# Patient Record
Sex: Female | Born: 1965 | Race: Asian | Hispanic: No | Smoking: Never smoker
Health system: Southern US, Community
[De-identification: ages and names within clinical notes are randomized; demographics above are authoritative.]

## PROBLEM LIST (undated history)

## (undated) DIAGNOSIS — K219 Gastro-esophageal reflux disease without esophagitis: Secondary | ICD-10-CM

## (undated) DIAGNOSIS — R922 Inconclusive mammogram: Secondary | ICD-10-CM

## (undated) DIAGNOSIS — R011 Cardiac murmur, unspecified: Secondary | ICD-10-CM

## (undated) DIAGNOSIS — R87619 Unspecified abnormal cytological findings in specimens from cervix uteri: Secondary | ICD-10-CM

## (undated) DIAGNOSIS — M5412 Radiculopathy, cervical region: Secondary | ICD-10-CM

## (undated) DIAGNOSIS — R9230 Dense breasts, unspecified: Secondary | ICD-10-CM

## (undated) DIAGNOSIS — I080 Rheumatic disorders of both mitral and aortic valves: Secondary | ICD-10-CM

## (undated) DIAGNOSIS — N893 Dysplasia of vagina, unspecified: Secondary | ICD-10-CM

## (undated) DIAGNOSIS — M2041 Other hammer toe(s) (acquired), right foot: Secondary | ICD-10-CM

## (undated) DIAGNOSIS — J302 Other seasonal allergic rhinitis: Secondary | ICD-10-CM

## (undated) DIAGNOSIS — D1803 Hemangioma of intra-abdominal structures: Secondary | ICD-10-CM

## (undated) DIAGNOSIS — M1990 Unspecified osteoarthritis, unspecified site: Secondary | ICD-10-CM

## (undated) HISTORY — DX: Unspecified abnormal cytological findings in specimens from cervix uteri: R87.619

## (undated) HISTORY — PX: CHOLECYSTECTOMY: SHX55

## (undated) HISTORY — DX: Dense breasts, unspecified: R92.30

## (undated) HISTORY — PX: ABDOMINAL HYSTERECTOMY: SHX81

## (undated) HISTORY — DX: Dysplasia of vagina, unspecified: N89.3

## (undated) HISTORY — DX: Gastro-esophageal reflux disease without esophagitis: K21.9

## (undated) HISTORY — DX: Inconclusive mammogram: R92.2

---

## 2003-12-10 HISTORY — PX: VAGINAL HYSTERECTOMY: SHX2639

## 2007-01-21 ENCOUNTER — Ambulatory Visit: Payer: Self-pay | Admitting: Gynecologic Oncology

## 2007-06-09 ENCOUNTER — Ambulatory Visit: Payer: Self-pay | Admitting: Gynecologic Oncology

## 2007-09-01 ENCOUNTER — Ambulatory Visit: Payer: Self-pay | Admitting: Gynecologic Oncology

## 2007-12-08 ENCOUNTER — Ambulatory Visit: Payer: Self-pay | Admitting: Gynecologic Oncology

## 2008-07-05 ENCOUNTER — Ambulatory Visit: Payer: Self-pay | Admitting: Gynecologic Oncology

## 2009-01-03 ENCOUNTER — Ambulatory Visit: Payer: Self-pay | Admitting: Gynecologic Oncology

## 2009-03-09 ENCOUNTER — Ambulatory Visit: Payer: Self-pay | Admitting: Gynecologic Oncology

## 2009-03-14 ENCOUNTER — Ambulatory Visit: Payer: Self-pay | Admitting: Gynecologic Oncology

## 2010-05-09 ENCOUNTER — Ambulatory Visit: Payer: Self-pay | Admitting: Gynecologic Oncology

## 2010-05-29 ENCOUNTER — Ambulatory Visit: Payer: Self-pay | Admitting: Gynecologic Oncology

## 2010-06-08 ENCOUNTER — Ambulatory Visit: Payer: Self-pay | Admitting: Gynecologic Oncology

## 2011-10-22 ENCOUNTER — Ambulatory Visit: Payer: Self-pay | Admitting: Gynecologic Oncology

## 2011-11-09 ENCOUNTER — Ambulatory Visit: Payer: Self-pay | Admitting: Gynecologic Oncology

## 2011-12-24 ENCOUNTER — Ambulatory Visit: Payer: Self-pay | Admitting: Gynecologic Oncology

## 2012-10-27 ENCOUNTER — Ambulatory Visit: Payer: Self-pay | Admitting: Gynecologic Oncology

## 2012-11-08 ENCOUNTER — Ambulatory Visit: Payer: Self-pay | Admitting: Gynecologic Oncology

## 2012-12-09 DIAGNOSIS — K5790 Diverticulosis of intestine, part unspecified, without perforation or abscess without bleeding: Secondary | ICD-10-CM

## 2012-12-09 HISTORY — DX: Diverticulosis of intestine, part unspecified, without perforation or abscess without bleeding: K57.90

## 2012-12-10 ENCOUNTER — Ambulatory Visit: Payer: Self-pay | Admitting: Surgery

## 2012-12-10 LAB — COMPREHENSIVE METABOLIC PANEL
Albumin: 3.5 g/dL (ref 3.4–5.0)
Anion Gap: 4 — ABNORMAL LOW (ref 7–16)
BUN: 17 mg/dL (ref 7–18)
Bilirubin,Total: 0.2 mg/dL (ref 0.2–1.0)
Calcium, Total: 8.9 mg/dL (ref 8.5–10.1)
Creatinine: 0.91 mg/dL (ref 0.60–1.30)
EGFR (African American): >60
Glucose: 90 mg/dL (ref 65–99)
Osmolality: 280 (ref 275–301)
Potassium: 4.5 mmol/L (ref 3.5–5.1)

## 2012-12-16 ENCOUNTER — Ambulatory Visit: Payer: Self-pay | Admitting: Surgery

## 2012-12-16 HISTORY — PX: CHOLECYSTECTOMY: SHX55

## 2012-12-17 LAB — PATHOLOGY REPORT

## 2012-12-25 ENCOUNTER — Encounter: Payer: Self-pay | Admitting: Internal Medicine

## 2012-12-25 ENCOUNTER — Ambulatory Visit (INDEPENDENT_AMBULATORY_CARE_PROVIDER_SITE_OTHER): Payer: BC Managed Care – PPO | Admitting: Internal Medicine

## 2012-12-25 DIAGNOSIS — Z8619 Personal history of other infectious and parasitic diseases: Secondary | ICD-10-CM | POA: Insufficient documentation

## 2012-12-25 DIAGNOSIS — Z90710 Acquired absence of both cervix and uterus: Secondary | ICD-10-CM | POA: Insufficient documentation

## 2012-12-25 DIAGNOSIS — D1803 Hemangioma of intra-abdominal structures: Secondary | ICD-10-CM

## 2012-12-25 DIAGNOSIS — Z9049 Acquired absence of other specified parts of digestive tract: Secondary | ICD-10-CM | POA: Insufficient documentation

## 2012-12-25 DIAGNOSIS — Z0000 Encounter for general adult medical examination without abnormal findings: Secondary | ICD-10-CM

## 2012-12-25 DIAGNOSIS — Z9089 Acquired absence of other organs: Secondary | ICD-10-CM

## 2012-12-25 DIAGNOSIS — B069 Rubella without complication: Secondary | ICD-10-CM | POA: Insufficient documentation

## 2012-12-25 DIAGNOSIS — Z789 Other specified health status: Secondary | ICD-10-CM

## 2012-12-25 DIAGNOSIS — O98519 Other viral diseases complicating pregnancy, unspecified trimester: Secondary | ICD-10-CM | POA: Insufficient documentation

## 2012-12-25 DIAGNOSIS — Z23 Encounter for immunization: Secondary | ICD-10-CM

## 2012-12-25 MED ORDER — ATOVAQUONE-PROGUANIL HCL 250-100 MG PO TABS: 1.0000 | ORAL_TABLET | Freq: Every day | ORAL | Status: DC

## 2012-12-25 MED ORDER — AZITHROMYCIN 500 MG PO TABS: ORAL_TABLET | ORAL | Status: DC

## 2012-12-25 NOTE — Progress Notes (Signed)
[facility]  RFV: going to [place] and [place] for 1 month Subjective:    Patient ID: Cassandra Wang, female    DOB: 01-22-1966, 47 y.o.   MRN: [id number]  HPI  46 yo F originally from [place], but now resides in the [place] since 1984, going on a trip from feb 18- march 19. To [place] and [place]. Going on trip with her female companion. Predominantly doing tours and visiting family in [place], and just tours in [place].  She is recovering from cholecystectomy performed 10 days ago. Unexpected rash from chlorhexadine prep.  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 measles  Review of Systems     Objective:   Physical Exam        Assessment & Plan:  1) malaria proph = only needed when they go to [place] area in [place]. Will give #17 tabs. Plus precautions   2) traveler's diarrhea= will give rx for azithromycin. With directions how to use. Plus precautions   3) pre travel vax = will give hep A #1, typhoid inj,MMR, and Tdap  4) motion sickness = can take dramamine as needed   rtc in 6 months for hep A #2

## 2012-12-31 ENCOUNTER — Ambulatory Visit: Payer: Self-pay | Admitting: Oncology

## 2013-01-13 ENCOUNTER — Ambulatory Visit: Payer: Self-pay | Admitting: Oncology

## 2013-01-15 ENCOUNTER — Ambulatory Visit: Payer: Self-pay | Admitting: Gastroenterology

## 2013-03-29 ENCOUNTER — Encounter: Payer: Self-pay | Admitting: *Deleted

## 2013-06-18 ENCOUNTER — Other Ambulatory Visit: Payer: Self-pay | Admitting: *Deleted

## 2013-06-18 ENCOUNTER — Ambulatory Visit (INDEPENDENT_AMBULATORY_CARE_PROVIDER_SITE_OTHER): Payer: BC Managed Care – PPO | Admitting: *Deleted

## 2013-06-18 DIAGNOSIS — Z789 Other specified health status: Secondary | ICD-10-CM

## 2013-06-18 DIAGNOSIS — Z23 Encounter for immunization: Secondary | ICD-10-CM

## 2013-06-18 MED ORDER — ATOVAQUONE-PROGUANIL HCL 250-100 MG PO TABS: 1.0000 | ORAL_TABLET | Freq: Every day | ORAL | Status: DC

## 2013-06-18 NOTE — Addendum Note (Signed)
Addended by: Jennet Maduro D on: 06/18/2013 09:39 AM   Modules accepted: Orders

## 2013-07-21 ENCOUNTER — Emergency Department: Payer: Self-pay | Admitting: Emergency Medicine

## 2013-07-21 ENCOUNTER — Ambulatory Visit: Payer: Self-pay | Admitting: Gynecologic Oncology

## 2013-07-21 LAB — BASIC METABOLIC PANEL
Anion Gap: 3 — ABNORMAL LOW (ref 7–16)
BUN: 18 mg/dL (ref 7–18)
Creatinine: 0.79 mg/dL (ref 0.60–1.30)
EGFR (African American): >60
Potassium: 3.5 mmol/L (ref 3.5–5.1)

## 2013-07-21 LAB — CBC
HGB: 13.6 g/dL (ref 12.0–16.0)
MCH: 28.9 pg (ref 26.0–34.0)
RDW: 13.4 % (ref 11.5–14.5)
WBC: 8.2 10*3/uL (ref 3.6–11.0)

## 2013-07-21 LAB — CK TOTAL AND CKMB (NOT AT ARMC): CK-MB: 2.5 ng/mL (ref 0.5–3.6)

## 2013-07-21 LAB — TROPONIN I: Troponin-I: <0.02 ng/mL

## 2013-07-22 HISTORY — PX: COLONOSCOPY: SHX174

## 2013-07-23 ENCOUNTER — Ambulatory Visit: Payer: Self-pay | Admitting: Gastroenterology

## 2013-07-26 LAB — PATHOLOGY REPORT

## 2013-10-05 ENCOUNTER — Ambulatory Visit: Payer: Self-pay | Admitting: Gynecologic Oncology

## 2013-10-09 ENCOUNTER — Ambulatory Visit: Payer: Self-pay | Admitting: Gynecologic Oncology

## 2014-03-25 ENCOUNTER — Ambulatory Visit: Payer: Self-pay | Admitting: Gastroenterology

## 2014-05-13 ENCOUNTER — Ambulatory Visit: Payer: Self-pay | Admitting: Gastroenterology

## 2014-10-05 ENCOUNTER — Ambulatory Visit: Payer: Self-pay | Admitting: Obstetrics and Gynecology

## 2014-10-09 ENCOUNTER — Ambulatory Visit: Payer: Self-pay | Admitting: Obstetrics and Gynecology

## 2014-12-06 ENCOUNTER — Ambulatory Visit: Payer: Self-pay | Admitting: Obstetrics and Gynecology

## 2015-03-31 NOTE — Op Note (Signed)
PATIENT NAME:  Cassandra Wang, Cassandra Wang MR#:  [id number] DATE OF BIRTH:  21-Jul-1966  DATE OF PROCEDURE:  12/16/2012  PREOPERATIVE DIAGNOSIS: Chronic cholecystitis.   POSTOPERATIVE DIAGNOSIS:  Chronic cholecystitis.  PROCEDURE PERFORMED:  Laparoscopic cholecystectomy, cholangiogram.   SURGEON:  Rochel Brome, M.D.  ANESTHESIA:  General.   INDICATIONS: This 49 year old female has a history of epigastric discomfort. Ultrasound findings of thickened gallbladder wall with possible gallbladder wall polyp.  Some of her symptoms were likely related to gallbladder inflammation, and surgery was recommended for definitive treatment.   DESCRIPTION OF PROCEDURE:  The patient was placed on the operating table in the supine position under general endotracheal anesthesia. The abdomen was prepared with ChloraPrep, draped in a sterile manner.   A short incision was made in the inferior aspect of the umbilicus, carried down to the deep fascia, which was grasped with laryngeal hook and elevated. A Veress needle was inserted, aspirated and irrigated with a saline solution. Next, the peritoneal cavity was inflated with carbon dioxide to a volume of some 3 liters. The Veress needle was used.  A 10 mm cannula was inserted. The 10 mm zero degree laparoscope was inserted to view the peritoneal cavity. The liver appeared normal. Stomach appeared normal, and the omentum was observed, and a few loops of small bowel appeared normal. The patient was placed in the reverse Trendelenburg position, turned several degrees to the left. Another incision was made in the epigastrium, slightly to the right of the midline, to introduce an 11 mm cannula. Two incisions were made in the lateral aspect of the right upper quadrant to introduce two 5 mm cannulas.   The gallbladder was found to have a thickened wall, somewhat white appearance, and was retracted towards the right shoulder. The infundibulum was retracted inferiorly and laterally. Some  fatty tissue was dissected away from the gallbladder neck. The gallbladder neck was mobilized with incision of the visceral peritoneum. The cystic artery was encountered and was dissected free from surrounding structures. It appeared that the cystic artery precluded further dissection deeper, and it was positively identified and was divided with double endoclips and this allowed better traction on the gallbladder and exposure of the cystic duct. The porta hepatis was fully demonstrated. The cystic duct was dissected free from the surrounding structures and dissected gallbladder neck further away from the liver, so that all that remained was the cystic duct. Next, an endoclip was placed across the cystic duct adjacent to the neck of the gallbladder. An incision was made in the cystic duct to introduce a Reddick catheter. Half-strength Conray 60 dye was injected, demonstrating the biliary tree and the balloon was in the common bile duct. There was prompt flow of dye into the duodenum. No retained stones were seen. The balloon was deflated and demonstrated that appearance of the balloon was resolved, and there was no stone within the common bile duct.  Next, the Reddick catheter was removed. The cystic duct was doubly ligated with endoclips and divided. The gallbladder was dissected free from the liver with hook and cautery. Several bleeding points were cauterized. The site was irrigated with heparinized saline solution and aspirated. Hemostasis was subsequently intact, as the gallbladder was completely separated. The gallbladder was delivered up through the infraumbilical incision, opened and suctioned, draining bile. There was thickening of the gallbladder wall and would not come up through the current incision; therefore, the incision was lengthened by some 8 mm and also lengthened the fascial incision and then removed the gallbladder.  It did have thickened wall. No palpable stones. Was submitted in formalin for  routine pathology. Next, the cannulas were removed, allowing carbon dioxide to escape from the peritoneal cavity. The fascial defect at the umbilicus was closed with 0 Vicryl figure-of-eight sutures. The skin incisions were closed with interrupted 5-0 chromic subcuticular sutures, benzoin and Steri-Strips. Dressings were applied with paper tape. The patient tolerated surgery satisfactorily and was prepared for transfer to the recovery room.   ____________________________ Lenna Sciara. Rochel Brome, MD jws:dm D: 12/16/2012 11:55:00 ET T: 12/16/2012 12:45:12 ET JOB#: [id number]  cc: Loreli Dollar, MD, <Dictator> Loreli Dollar MD ELECTRONICALLY SIGNED 12/18/2012 18:59

## 2015-09-21 ENCOUNTER — Other Ambulatory Visit: Payer: Self-pay | Admitting: Orthopedic Surgery

## 2015-09-21 DIAGNOSIS — M25562 Pain in left knee: Secondary | ICD-10-CM

## 2015-09-28 ENCOUNTER — Ambulatory Visit
Admission: RE | Admit: 2015-09-28 | Discharge: 2015-09-28 | Disposition: A | Discharge status: Home / Self Care | Payer: 59 | Source: Ambulatory Visit | Attending: Orthopedic Surgery | Admitting: Orthopedic Surgery

## 2015-09-28 DIAGNOSIS — M25562 Pain in left knee: Secondary | ICD-10-CM

## 2015-09-28 DIAGNOSIS — M94262 Chondromalacia, left knee: Secondary | ICD-10-CM | POA: Diagnosis not present

## 2015-09-29 ENCOUNTER — Ambulatory Visit: Payer: Self-pay

## 2015-11-14 ENCOUNTER — Encounter: Payer: Self-pay | Admitting: Obstetrics and Gynecology

## 2015-11-16 ENCOUNTER — Encounter: Payer: Self-pay | Admitting: Obstetrics and Gynecology

## 2015-11-16 ENCOUNTER — Ambulatory Visit (INDEPENDENT_AMBULATORY_CARE_PROVIDER_SITE_OTHER): Payer: 59 | Admitting: Obstetrics and Gynecology

## 2015-11-16 VITALS — BP 116/80 | HR 73 | Ht 64.0 in | Wt 176.1 lb

## 2015-11-16 DIAGNOSIS — N893 Dysplasia of vagina, unspecified: Secondary | ICD-10-CM

## 2015-11-16 DIAGNOSIS — Z1239 Encounter for other screening for malignant neoplasm of breast: Secondary | ICD-10-CM

## 2015-11-16 DIAGNOSIS — Z01419 Encounter for gynecological examination (general) (routine) without abnormal findings: Secondary | ICD-10-CM

## 2015-11-16 DIAGNOSIS — N951 Menopausal and female climacteric states: Secondary | ICD-10-CM

## 2015-11-16 NOTE — Progress Notes (Signed)
GYNECOLOGY  ANNUAL EXAM CLINIC PROGRESS NOTE  Subjective:    Cassandra Wang is a 49 y.o. P83 female who presents for annual exam. The patient has no complaints today. The patient is not sexually active. GYN screening history: last pap: approximate date 09/2014 and was normal and last mammogram: approximate date 12/06/2014 and was abnormal: BIRADS 2: probably benign. Dense breast tissue. Last colonoscopy: 2015, normal. The patient is not taking hormone replacement therapy. Patient denies post-menopausal vaginal bleeding.. The patient wears seatbelts: yes. The patient participates in regular exercise: no. Has the patient ever been transfused or tattooed?: no. The patient reports that there is not domestic violence in her life.   Menstrual History: OB History    Gravida Para Term Preterm AB TAB SAB Ectopic Multiple Living   3 3 2 1      3       Menarche age: 9  No LMP recorded. Patient has had a hysterectomy.  Has h/o abnormal pap smears (VAIN).  Denies h/o STIs.    Past Medical History  Diagnosis Date  . Abnormal Pap smear of cervix   . GERD (gastroesophageal reflux disease)   . 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  Marland Kitchen Dense breast tissue      Past Surgical History  Procedure Laterality Date  . Abdominal hysterectomy    . Cholecystectomy      History reviewed. No pertinent family history.   Social History   Social History  . Marital Status: Single    Spouse Name: N/A  . Number of Children: N/A  . Years of Education: N/A   Occupational History  . Not on file.   Social History Main Topics  . Smoking status: Never Smoker   . Smokeless tobacco: Not on file  . Alcohol Use: 0.6 oz/week    1 Glasses of wine per week  . Drug Use: No  . Sexual Activity: Not on file   Other Topics Concern  . Not on file   Social History Narrative  . No narrative on file     Outpatient Encounter Prescriptions as of 11/16/2015  Medication Sig  . [DISCONTINUED] atovaquone-proguanil (MALARONE) 250-100 MG TABS Take 1 tablet by mouth daily.  . [DISCONTINUED] azithromycin (ZITHROMAX) 500 MG tablet Take 2 tabs by mouth daily if needed for diarrhea (> 3 loose stools/day)   No facility-administered encounter medications on file as of 11/16/2015.    Allergies  Allergen Reactions  . Latex   . Gluconate Rash  . Tape Rash    Review of Systems Constitutional: negative for chills, fatigue, fevers and sweats Eyes: negative for irritation, redness and visual disturbance Ears, nose, mouth, throat, and face: negative for hearing loss, nasal congestion, snoring and tinnitus Respiratory: negative for asthma, cough, sputum Cardiovascular: negative for chest pain, dyspnea, exertional chest pressure/discomfort, irregular heart beat, palpitations and syncope Gastrointestinal: negative for abdominal pain, change in bowel habits, nausea and vomiting Genitourinary: negative for abnormal menstrual periods, genital lesions, sexual problems and vaginal discharge, dysuria and urinary incontinence Integument/breast: negative for breast lump, breast tenderness and nipple discharge Hematologic/lymphatic: negative for bleeding and easy bruising Musculoskeletal:negative for back pain and muscle weakness Neurological: negative for dizziness, headaches, vertigo and weakness Endocrine: negative for diabetic symptoms including polydipsia, polyuria and skin dryness Allergic/Immunologic: negative for hay fever and urticaria    Objective:   Blood pressure 116/80, pulse 73, height 5'  4" (1.626 m), weight 176 lb 1.6 oz (79.878 kg). Body mass index is 30.21 kg/(m^2).   General Appearance:    Alert, cooperative, no distress, appears stated age  Head:    Normocephalic, without obvious abnormality, atraumatic  Eyes:    PERRL, conjunctiva/corneas clear, EOM's intact, both eyes  Ears:    Normal  external ear canals, both ears  Nose:   Nares normal, septum midline, mucosa normal, no drainage or sinus tenderness  Throat:   Lips, mucosa, and tongue normal; teeth and gums normal  Neck:   Supple, symmetrical, trachea midline, no adenopathy; thyroid: no enlargement/tenderness/nodules; no carotid bruit or JVD  Back:     Symmetric, no curvature, ROM normal, no CVA tenderness  Lungs:     Clear to auscultation bilaterally, respirations unlabored  Chest Wall:    No tenderness or deformity   Heart:    Regular rate and rhythm, S1 and S2 normal, no murmur, rub or gallop  Breast Exam:    No tenderness, masses, or nipple abnormality  Abdomen:     Soft, non-tender, bowel sounds active all four quadrants, no masses, no organomegaly.    Genitalia:    Pelvic:external genitalia normal, vagina without lesions, discharge, or tenderness, cuff intact.  Rectovaginal septum  normal. No adnexal masses or tenderness.    Rectal:    Normal external sphincter.  No hemorrhoids appreciated. Internal exam not done.   Extremities:   Extremities normal, atraumatic, no cyanosis or edema  Pulses:   2+ and symmetric all extremities  Skin:   Skin color, texture, turgor normal, no rashes or lesions  Lymph nodes:   Cervical, supraclavicular, and axillary nodes normal  Neurologic:   CNII-XII intact, normal strength, sensation and reflexes throughout      Assessment:    Normal gyn exam Menopause   H/o VAIN   Plan:   Blood tests: CBC with diff, Comprehensive metabolic panel, Lipoproteins and TSH. Breast self exam technique reviewed and patient encouraged to perform self-exam monthly. Discussed healthy lifestyle modifications. Mammogram.  Patient encouraged to have 3D mammography due to dense breat tissue.  Pap smear not indicated until 2018.  Has had 3 consecutive normal paps.  Can resume routine screening.  Is up to date on flu vaccination.    Rubie Maid, MD [facility]

## 2015-11-17 LAB — CBC
HEMOGLOBIN: 13.6 g/dL (ref 11.1–15.9)
Hematocrit: 41.3 % (ref 34.0–46.6)
MCH: 27.9 pg (ref 26.6–33.0)
MCHC: 32.9 g/dL (ref 31.5–35.7)
MCV: 85 fL (ref 79–97)
PLATELETS: 278 10*3/uL (ref 150–379)
RBC: 4.88 x10E6/uL (ref 3.77–5.28)
RDW: 13.5 % (ref 12.3–15.4)
WBC: 7.2 10*3/uL (ref 3.4–10.8)

## 2015-11-17 LAB — COMPREHENSIVE METABOLIC PANEL
ALBUMIN: 4.5 g/dL (ref 3.5–5.5)
ALK PHOS: 92 IU/L (ref 39–117)
ALT: 13 IU/L (ref 0–32)
AST: 14 IU/L (ref 0–40)
Albumin/Globulin Ratio: 1.7 (ref 1.1–2.5)
BILIRUBIN TOTAL: 0.3 mg/dL (ref 0.0–1.2)
BUN / CREAT RATIO: 21 (ref 9–23)
BUN: 16 mg/dL (ref 6–24)
CHLORIDE: 101 mmol/L (ref 97–106)
CO2: 26 mmol/L (ref 18–29)
CREATININE: 0.75 mg/dL (ref 0.57–1.00)
Calcium: 9.6 mg/dL (ref 8.7–10.2)
GFR calc non Af Amer: 93 mL/min/{1.73_m2} (ref >59)
GFR, EST AFRICAN AMERICAN: 107 mL/min/{1.73_m2} (ref >59)
GLOBULIN, TOTAL: 2.6 g/dL (ref 1.5–4.5)
GLUCOSE: 86 mg/dL (ref 65–99)
POTASSIUM: 4.5 mmol/L (ref 3.5–5.2)
SODIUM: 141 mmol/L (ref 136–144)
TOTAL PROTEIN: 7.1 g/dL (ref 6.0–8.5)

## 2015-11-17 LAB — LIPID PANEL
CHOLESTEROL TOTAL: 217 mg/dL — AB (ref 100–199)
Chol/HDL Ratio: 4.3 ratio units (ref 0.0–4.4)
HDL: 51 mg/dL (ref >39)
LDL Calculated: 118 mg/dL — ABNORMAL HIGH (ref 0–99)
Triglycerides: 239 mg/dL — ABNORMAL HIGH (ref 0–149)
VLDL Cholesterol Cal: 48 mg/dL — ABNORMAL HIGH (ref 5–40)

## 2015-11-17 LAB — TSH: TSH: 0.952 u[IU]/mL (ref 0.450–4.500)

## 2015-12-19 ENCOUNTER — Other Ambulatory Visit: Payer: Self-pay | Admitting: Obstetrics and Gynecology

## 2015-12-19 ENCOUNTER — Other Ambulatory Visit: Payer: Self-pay | Admitting: Family Medicine

## 2015-12-19 ENCOUNTER — Ambulatory Visit
Admission: RE | Admit: 2015-12-19 | Discharge: 2015-12-19 | Disposition: A | Discharge status: Home / Self Care | Payer: BLUE CROSS/BLUE SHIELD | Source: Ambulatory Visit | Attending: Family Medicine | Admitting: Family Medicine

## 2015-12-19 ENCOUNTER — Ambulatory Visit: Payer: BLUE CROSS/BLUE SHIELD

## 2015-12-19 DIAGNOSIS — Z1239 Encounter for other screening for malignant neoplasm of breast: Secondary | ICD-10-CM

## 2015-12-19 DIAGNOSIS — Z1231 Encounter for screening mammogram for malignant neoplasm of breast: Secondary | ICD-10-CM | POA: Diagnosis not present

## 2015-12-21 ENCOUNTER — Ambulatory Visit: Payer: 59

## 2016-02-08 ENCOUNTER — Other Ambulatory Visit: Payer: Self-pay | Admitting: Obstetrics and Gynecology

## 2016-02-08 DIAGNOSIS — Z1231 Encounter for screening mammogram for malignant neoplasm of breast: Secondary | ICD-10-CM

## 2016-11-21 ENCOUNTER — Encounter: Payer: 59 | Admitting: Obstetrics and Gynecology

## 2016-12-23 ENCOUNTER — Ambulatory Visit: Payer: BLUE CROSS/BLUE SHIELD

## 2017-01-07 ENCOUNTER — Ambulatory Visit
Admission: RE | Admit: 2017-01-07 | Discharge: 2017-01-07 | Disposition: A | Discharge status: Home / Self Care | Payer: BLUE CROSS/BLUE SHIELD | Source: Ambulatory Visit | Attending: Obstetrics and Gynecology | Admitting: Obstetrics and Gynecology

## 2017-01-07 DIAGNOSIS — Z1231 Encounter for screening mammogram for malignant neoplasm of breast: Secondary | ICD-10-CM | POA: Diagnosis not present

## 2017-12-10 ENCOUNTER — Other Ambulatory Visit: Payer: Self-pay | Admitting: Family Medicine

## 2017-12-10 DIAGNOSIS — Z1231 Encounter for screening mammogram for malignant neoplasm of breast: Secondary | ICD-10-CM

## 2017-12-16 LAB — HM HIV SCREENING LAB: HM HIV Screening: NEGATIVE

## 2018-01-14 ENCOUNTER — Ambulatory Visit
Admission: RE | Admit: 2018-01-14 | Discharge: 2018-01-14 | Disposition: A | Discharge status: Home / Self Care | Payer: BLUE CROSS/BLUE SHIELD | Source: Ambulatory Visit | Attending: Family Medicine | Admitting: Family Medicine

## 2018-01-14 DIAGNOSIS — Z1231 Encounter for screening mammogram for malignant neoplasm of breast: Secondary | ICD-10-CM | POA: Insufficient documentation

## 2018-12-14 ENCOUNTER — Other Ambulatory Visit: Payer: Self-pay | Admitting: Family Medicine

## 2018-12-14 DIAGNOSIS — Z1231 Encounter for screening mammogram for malignant neoplasm of breast: Secondary | ICD-10-CM

## 2019-02-03 ENCOUNTER — Ambulatory Visit
Admission: RE | Admit: 2019-02-03 | Discharge: 2019-02-03 | Disposition: A | Discharge status: Home / Self Care | Payer: PRIVATE HEALTH INSURANCE | Source: Ambulatory Visit | Attending: Family Medicine | Admitting: Family Medicine

## 2019-02-03 DIAGNOSIS — Z1231 Encounter for screening mammogram for malignant neoplasm of breast: Secondary | ICD-10-CM | POA: Insufficient documentation

## 2019-02-08 ENCOUNTER — Other Ambulatory Visit: Payer: Self-pay | Admitting: Family Medicine

## 2019-02-08 DIAGNOSIS — N6489 Other specified disorders of breast: Secondary | ICD-10-CM

## 2019-02-08 DIAGNOSIS — R928 Other abnormal and inconclusive findings on diagnostic imaging of breast: Secondary | ICD-10-CM

## 2019-02-12 ENCOUNTER — Ambulatory Visit
Admission: RE | Admit: 2019-02-12 | Discharge: 2019-02-12 | Disposition: A | Discharge status: Home / Self Care | Payer: PRIVATE HEALTH INSURANCE | Source: Ambulatory Visit | Attending: Family Medicine | Admitting: Family Medicine

## 2019-02-12 DIAGNOSIS — N6489 Other specified disorders of breast: Secondary | ICD-10-CM | POA: Insufficient documentation

## 2019-02-12 DIAGNOSIS — R928 Other abnormal and inconclusive findings on diagnostic imaging of breast: Secondary | ICD-10-CM | POA: Insufficient documentation

## 2019-02-17 ENCOUNTER — Ambulatory Visit: Payer: PRIVATE HEALTH INSURANCE

## 2019-02-17 ENCOUNTER — Other Ambulatory Visit: Payer: PRIVATE HEALTH INSURANCE

## 2019-07-22 ENCOUNTER — Ambulatory Visit (INDEPENDENT_AMBULATORY_CARE_PROVIDER_SITE_OTHER): Payer: PRIVATE HEALTH INSURANCE | Admitting: Otolaryngology

## 2019-07-22 ENCOUNTER — Other Ambulatory Visit: Payer: Self-pay

## 2019-07-22 DIAGNOSIS — R040 Epistaxis: Secondary | ICD-10-CM | POA: Diagnosis not present

## 2019-08-26 ENCOUNTER — Ambulatory Visit (INDEPENDENT_AMBULATORY_CARE_PROVIDER_SITE_OTHER): Payer: PRIVATE HEALTH INSURANCE | Admitting: Otolaryngology

## 2019-08-26 DIAGNOSIS — R040 Epistaxis: Secondary | ICD-10-CM

## 2020-03-01 ENCOUNTER — Ambulatory Visit: Payer: PRIVATE HEALTH INSURANCE | Admitting: Dermatology

## 2020-04-05 ENCOUNTER — Ambulatory Visit (INDEPENDENT_AMBULATORY_CARE_PROVIDER_SITE_OTHER): Payer: 59 | Admitting: Dermatology

## 2020-04-05 ENCOUNTER — Other Ambulatory Visit: Payer: Self-pay

## 2020-04-05 DIAGNOSIS — B351 Tinea unguium: Secondary | ICD-10-CM

## 2020-04-05 NOTE — Progress Notes (Signed)
   Follow-Up Visit   Subjective  Cassandra Wang is a 54 y.o. female who presents for the following: Follow-up (Tinea unguium follow up - right great toenail. Seems to be improving. Taking Fluconazole 200mg  1 po qweek.).    The following portions of the chart were reviewed this encounter and updated as appropriate:  Allergies  Meds  Problems  Med Hx  Surg Hx  Fam Hx      Review of Systems:  No other skin or systemic complaints except as noted in HPI or Assessment and Plan.  Objective  Well appearing patient in no apparent distress; mood and affect are within normal limits.  A focused examination was performed including right foot. Relevant physical exam findings are noted in the Assessment and Plan.  Objective  Right great toenail: Nail dystrophy.  Images         Assessment & Plan  Tinea unguium Right great toenail  Improving on systemic medications with potential side effects. Pt is tolerating well.  Continue Fluconazole 200mg  1 po qweek - patient has medication.  Return in about 6 months (around 10/05/2020).  I, Ashok Cordia, CMA, am acting as scribe for Sarina Ser, MD .    Documentation: I have reviewed the above documentation for accuracy and completeness, and I agree with the above.  Sarina Ser, MD

## 2020-04-08 ENCOUNTER — Encounter: Payer: Self-pay | Admitting: Dermatology

## 2020-06-16 ENCOUNTER — Other Ambulatory Visit: Payer: Self-pay | Admitting: Family Medicine

## 2020-06-16 DIAGNOSIS — Z1231 Encounter for screening mammogram for malignant neoplasm of breast: Secondary | ICD-10-CM

## 2020-06-29 ENCOUNTER — Ambulatory Visit
Admission: RE | Admit: 2020-06-29 | Discharge: 2020-06-29 | Disposition: A | Discharge status: Home / Self Care | Payer: 59 | Source: Ambulatory Visit | Attending: Family Medicine | Admitting: Family Medicine

## 2020-06-29 DIAGNOSIS — Z1231 Encounter for screening mammogram for malignant neoplasm of breast: Secondary | ICD-10-CM | POA: Insufficient documentation

## 2020-09-18 ENCOUNTER — Other Ambulatory Visit: Payer: Self-pay | Admitting: Student

## 2020-09-18 DIAGNOSIS — M5416 Radiculopathy, lumbar region: Secondary | ICD-10-CM

## 2020-10-04 ENCOUNTER — Ambulatory Visit (INDEPENDENT_AMBULATORY_CARE_PROVIDER_SITE_OTHER): Payer: 59 | Admitting: Dermatology

## 2020-10-04 ENCOUNTER — Other Ambulatory Visit: Payer: Self-pay

## 2020-10-04 DIAGNOSIS — L920 Granuloma annulare: Secondary | ICD-10-CM

## 2020-10-04 DIAGNOSIS — L578 Other skin changes due to chronic exposure to nonionizing radiation: Secondary | ICD-10-CM | POA: Diagnosis not present

## 2020-10-04 DIAGNOSIS — B351 Tinea unguium: Secondary | ICD-10-CM

## 2020-10-04 DIAGNOSIS — L811 Chloasma: Secondary | ICD-10-CM

## 2020-10-04 MED ORDER — MOMETASONE FUROATE 0.1 % EX CREA
1.0000 | TOPICAL_CREAM | Freq: Every day | CUTANEOUS | 11 refills | Status: DC | PRN
Start: 2020-10-04 — End: 2024-02-06

## 2020-10-04 MED ORDER — AMBULATORY NON FORMULARY MEDICATION: Status: DC

## 2020-10-04 NOTE — Patient Instructions (Addendum)
Recommend taking Heliocare sun protection supplement daily in sunny weather for additional sun protection. For maximum protection on the sunniest days, you can take up to 2 capsules of regular Heliocare OR take 1 capsule of Heliocare Ultra. For prolonged exposure (such as a full day in the sun), you can repeat your dose of the supplement 4 hours after your first dose. Heliocare can be purchased at [facility] or at [email or web address].    Instructions for [facility] Medications  One or more of your medications was sent to the [facility] mail order compounding pharmacy. You will receive an email from them and can purchase the medicine through that link. It will then be mailed to your home at the address you confirmed. If for any reason you do not receive an email from them, please check your spam folder. If you still do not find the email, please let us know. [facility] phone number is [phone].  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

## 2020-10-04 NOTE — Progress Notes (Signed)
   Follow-Up Visit   Subjective  Cassandra Wang is a 54 y.o. female who presents for the following: Rash (Check a rash on the elbows spreading x 1 months no symptoms ), Nail Problem (6 months f/u Tinea unguium on R great toenail, improving, took Diflucan talblet once a week x 6 monts finished 2 months ago ), and Skin Problem (brown spot on the L cheek worse when out in the sun).  The following portions of the chart were reviewed this encounter and updated as appropriate:  Allergies  Meds  Problems  Med Hx  Surg Hx  Fam Hx     Review of Systems:  No other skin or systemic complaints except as noted in HPI or Assessment and Plan.  Objective  Well appearing patient in no apparent distress; mood and affect are within normal limits.  A focused examination was performed including face, neck, chest and back and face, right foot, right arm, left arm. Relevant physical exam findings are noted in the Assessment and Plan.  Objective  Right Foot - Anterior, face, cheeks: Reticulated hyperpigmented patches.   Objective  Right great toe nail: Nail dyystrophy  Objective  R elbow, L elbow: Pink Annular patches 2 on the R elbow, 4 on the L elbow    Assessment & Plan  Melasma (2) face, cheeks Nancy Fetter will make this worse  Causes include hereditary, hormones, and sun. No matter the cause, it will not likely resolve but it can be treated and improved.  Nancy Fetter will make it worse even if treated. Ordered Medications: [facility] hydroquinone treatment without tretinoin. AMBULATORY NON FORMULARY MEDICATION Consider Heliocare especially this summer.  Sunscreen sun protection and sun clothing and sunglasses recommended.  Actinic Damage - diffuse scaly erythematous macules with underlying dyspigmentation - Recommend daily broad spectrum sunscreen SPF 30+ to sun-exposed areas, reapply every 2 hours as needed.  - Call for new or changing lesions.  Tinea unguium Right great toe nail Tinea  Unguium improving but will need to grow out a little more  Recheck in 3 months we may add Diflucan if no better   Granuloma annulare R elbow, L elbow Benign-appearing.  Observation.  Call clinic for new or changing moles.  Recommend daily use of broad spectrum spf 30+ sunscreen to sun-exposed areas.   Discussed treatment options ILK injections, topicals cream, pt would like to try topical cream.  Pamphlet given on Granuloma annulare   No perfect treatment to make go away   Start Mometasone Cream apply to skin qd-bid prn   Ordered Medications: mometasone (ELOCON) 0.1 % cream  Return in about 3 months (around 01/04/2021).  IMarye Round, CMA, am acting as scribe for Sarina Ser, MD .  Documentation: I have reviewed the above documentation for accuracy and completeness, and I agree with the above.  Sarina Ser, MD

## 2020-10-05 ENCOUNTER — Encounter: Payer: Self-pay | Admitting: Dermatology

## 2020-10-08 ENCOUNTER — Ambulatory Visit: Payer: 59

## 2021-01-10 ENCOUNTER — Ambulatory Visit: Payer: 59 | Admitting: Dermatology

## 2021-02-12 ENCOUNTER — Ambulatory Visit: Payer: 59 | Admitting: Dermatology

## 2021-03-08 ENCOUNTER — Ambulatory Visit (INDEPENDENT_AMBULATORY_CARE_PROVIDER_SITE_OTHER): Payer: 59 | Admitting: Dermatology

## 2021-03-08 ENCOUNTER — Other Ambulatory Visit: Payer: Self-pay

## 2021-03-08 DIAGNOSIS — Z872 Personal history of diseases of the skin and subcutaneous tissue: Secondary | ICD-10-CM | POA: Diagnosis not present

## 2021-03-08 DIAGNOSIS — B351 Tinea unguium: Secondary | ICD-10-CM | POA: Diagnosis not present

## 2021-03-08 DIAGNOSIS — L811 Chloasma: Secondary | ICD-10-CM

## 2021-03-08 MED ORDER — FLUCONAZOLE 200 MG PO TABS: ORAL_TABLET | ORAL | 0 refills | Status: DC

## 2021-03-08 NOTE — Patient Instructions (Addendum)
Recommend taking Heliocare sun protection supplement daily in sunny weather for additional sun protection. For maximum protection on the sunniest days, you can take up to 2 capsules of regular Heliocare OR take 1 capsule of Heliocare Ultra. For prolonged exposure (such as a full day in the sun), you can repeat your dose of the supplement 4 hours after your first dose. Heliocare can be purchased at [facility] or at [email or web address].

## 2021-03-08 NOTE — Progress Notes (Signed)
   Follow-Up Visit   Subjective  Cassandra Wang is a 55 y.o. female who presents for the following: Nail Problem (Check R toenail, fungus, treated with Difucan 200 mg 1 tablet a week for several months, finished Diflucan 6 months ago, toenails improving ). Check face dark spots, pt treated with [facility] compound fade cream, she stopped because she had a allergic reaction to the compound fade cream   The following portions of the chart were reviewed this encounter and updated as appropriate:   Allergies  Meds  Problems  Med Hx  Surg Hx  Fam Hx     Review of Systems:  No other skin or systemic complaints except as noted in HPI or Assessment and Plan.  Objective  Well appearing patient in no apparent distress; mood and affect are within normal limits.  A focused examination was performed including face,right foot. Relevant physical exam findings are noted in the Assessment and Plan.  Objective  face: Reticulated hyperpigmented patches.   Objective  Right Foot: Nail dystrophy    Assessment & Plan  Melasma face With history of rash with use of hydroquinone mix.  Recommend daily broad spectrum sunscreen SPF 30+ to sun-exposed areas, reapply every 2 hours as needed. Call for new or changing lesions.  Staying in the shade or wearing long sleeves, sun glasses (UVA+UVB protection) and wide brim hats (4-inch brim around the entire circumference of the hat) are also recommended for sun protection.    Recommend taking Heliocare sun protection supplement daily in sunny weather for additional sun protection. For maximum protection on the sunniest days, you can take up to 2 capsules of regular Heliocare OR take 1 capsule of Heliocare Ultra. For prolonged exposure (such as a full day in the sun), you can repeat your dose of the supplement 4 hours after your first dose. Heliocare can be purchased at [facility] or at [email or web address].    Melasma is a condition of  persistent pigmented patches generally on the face, worse in summer due to higher UV exposure.  Oral estrogen containing BCPs or supplements can exacerbate condition.  Recommend daily broad spectrum tinted sunscreen SPF 30+ to face, preferably with Zinc or Titanium Dioxide. Discussed Rx topical bleaching creams (i.e. hydroquinone), OTC HelioCare supplement, chemical peels (would need multiple for best result).   Tinea unguium toenails Tinea unguium  Chronic and persistent but improving. Improving on systemic medications with potential side effects. Pt is tolerating well.   Continue Fluconazole 200mg  1 po once a week for 8 weeks then stop #8 0RF  Ordered Medications: fluconazole (DIFLUCAN) 200 MG tablet  Return if symptoms worsen or fail to improve.  IMarye Round, CMA, am acting as scribe for Sarina Ser, MD .  Documentation: I have reviewed the above documentation for accuracy and completeness, and I agree with the above.  Sarina Ser, MD

## 2021-03-09 ENCOUNTER — Encounter: Payer: Self-pay | Admitting: Dermatology

## 2021-03-15 ENCOUNTER — Other Ambulatory Visit: Payer: Self-pay | Admitting: Family Medicine

## 2021-03-15 DIAGNOSIS — Z1231 Encounter for screening mammogram for malignant neoplasm of breast: Secondary | ICD-10-CM

## 2021-03-27 ENCOUNTER — Other Ambulatory Visit: Payer: Self-pay | Admitting: Student

## 2021-03-27 ENCOUNTER — Other Ambulatory Visit (HOSPITAL_COMMUNITY): Payer: Self-pay | Admitting: Student

## 2021-03-27 DIAGNOSIS — R42 Dizziness and giddiness: Secondary | ICD-10-CM

## 2021-03-27 DIAGNOSIS — R519 Headache, unspecified: Secondary | ICD-10-CM

## 2021-04-10 ENCOUNTER — Other Ambulatory Visit: Payer: Self-pay

## 2021-04-10 ENCOUNTER — Ambulatory Visit
Admission: RE | Admit: 2021-04-10 | Discharge: 2021-04-10 | Disposition: A | Discharge status: Home / Self Care | Payer: 59 | Source: Ambulatory Visit | Attending: Student | Admitting: Student

## 2021-04-10 DIAGNOSIS — R42 Dizziness and giddiness: Secondary | ICD-10-CM | POA: Diagnosis not present

## 2021-04-10 DIAGNOSIS — R519 Headache, unspecified: Secondary | ICD-10-CM | POA: Diagnosis present

## 2021-07-04 ENCOUNTER — Other Ambulatory Visit: Payer: Self-pay

## 2021-07-04 ENCOUNTER — Ambulatory Visit
Admission: RE | Admit: 2021-07-04 | Discharge: 2021-07-04 | Disposition: A | Discharge status: Home / Self Care | Payer: 59 | Source: Ambulatory Visit | Attending: Family Medicine | Admitting: Family Medicine

## 2021-07-04 DIAGNOSIS — Z1231 Encounter for screening mammogram for malignant neoplasm of breast: Secondary | ICD-10-CM | POA: Insufficient documentation

## 2021-07-11 ENCOUNTER — Other Ambulatory Visit: Payer: Self-pay | Admitting: Family Medicine

## 2021-07-11 DIAGNOSIS — N6320 Unspecified lump in the left breast, unspecified quadrant: Secondary | ICD-10-CM

## 2021-07-11 DIAGNOSIS — R928 Other abnormal and inconclusive findings on diagnostic imaging of breast: Secondary | ICD-10-CM

## 2021-07-16 ENCOUNTER — Ambulatory Visit
Admission: RE | Admit: 2021-07-16 | Discharge: 2021-07-16 | Disposition: A | Discharge status: Home / Self Care | Payer: 59 | Source: Ambulatory Visit | Attending: Family Medicine | Admitting: Family Medicine

## 2021-07-16 ENCOUNTER — Other Ambulatory Visit: Payer: Self-pay

## 2021-07-16 DIAGNOSIS — R928 Other abnormal and inconclusive findings on diagnostic imaging of breast: Secondary | ICD-10-CM

## 2021-07-16 DIAGNOSIS — N6320 Unspecified lump in the left breast, unspecified quadrant: Secondary | ICD-10-CM | POA: Diagnosis present

## 2021-07-19 ENCOUNTER — Other Ambulatory Visit: Payer: Self-pay | Admitting: Family Medicine

## 2021-07-19 DIAGNOSIS — N6002 Solitary cyst of left breast: Secondary | ICD-10-CM

## 2022-01-14 DIAGNOSIS — M5416 Radiculopathy, lumbar region: Secondary | ICD-10-CM | POA: Diagnosis not present

## 2022-01-14 DIAGNOSIS — M5442 Lumbago with sciatica, left side: Secondary | ICD-10-CM | POA: Diagnosis not present

## 2022-01-14 DIAGNOSIS — M25562 Pain in left knee: Secondary | ICD-10-CM | POA: Diagnosis not present

## 2022-01-14 DIAGNOSIS — M1712 Unilateral primary osteoarthritis, left knee: Secondary | ICD-10-CM | POA: Diagnosis not present

## 2022-01-21 ENCOUNTER — Other Ambulatory Visit: Payer: Self-pay

## 2022-01-21 ENCOUNTER — Ambulatory Visit
Admission: RE | Admit: 2022-01-21 | Discharge: 2022-01-21 | Disposition: A | Discharge status: Home / Self Care | Payer: 59 | Source: Ambulatory Visit | Attending: Family Medicine | Admitting: Family Medicine

## 2022-01-21 DIAGNOSIS — N6002 Solitary cyst of left breast: Secondary | ICD-10-CM | POA: Insufficient documentation

## 2022-01-21 DIAGNOSIS — N6322 Unspecified lump in the left breast, upper inner quadrant: Secondary | ICD-10-CM | POA: Diagnosis not present

## 2022-01-21 DIAGNOSIS — N6324 Unspecified lump in the left breast, lower inner quadrant: Secondary | ICD-10-CM | POA: Diagnosis not present

## 2022-01-24 ENCOUNTER — Other Ambulatory Visit: Payer: Self-pay | Admitting: Family Medicine

## 2022-01-24 DIAGNOSIS — N6320 Unspecified lump in the left breast, unspecified quadrant: Secondary | ICD-10-CM

## 2022-02-19 DIAGNOSIS — Z0000 Encounter for general adult medical examination without abnormal findings: Secondary | ICD-10-CM | POA: Diagnosis not present

## 2022-02-26 DIAGNOSIS — E785 Hyperlipidemia, unspecified: Secondary | ICD-10-CM | POA: Diagnosis not present

## 2022-02-26 DIAGNOSIS — M545 Low back pain, unspecified: Secondary | ICD-10-CM | POA: Diagnosis not present

## 2022-02-26 DIAGNOSIS — Z0000 Encounter for general adult medical examination without abnormal findings: Secondary | ICD-10-CM | POA: Diagnosis not present

## 2022-02-26 DIAGNOSIS — Z780 Asymptomatic menopausal state: Secondary | ICD-10-CM | POA: Diagnosis not present

## 2022-03-08 DIAGNOSIS — M8588 Other specified disorders of bone density and structure, other site: Secondary | ICD-10-CM | POA: Diagnosis not present

## 2022-03-14 DIAGNOSIS — M5416 Radiculopathy, lumbar region: Secondary | ICD-10-CM | POA: Diagnosis not present

## 2022-03-14 DIAGNOSIS — M6281 Muscle weakness (generalized): Secondary | ICD-10-CM | POA: Diagnosis not present

## 2022-03-29 DIAGNOSIS — M5416 Radiculopathy, lumbar region: Secondary | ICD-10-CM | POA: Diagnosis not present

## 2022-04-07 DIAGNOSIS — J029 Acute pharyngitis, unspecified: Secondary | ICD-10-CM | POA: Diagnosis not present

## 2022-04-07 DIAGNOSIS — L0100 Impetigo, unspecified: Secondary | ICD-10-CM | POA: Diagnosis not present

## 2022-04-07 DIAGNOSIS — B009 Herpesviral infection, unspecified: Secondary | ICD-10-CM | POA: Diagnosis not present

## 2022-04-07 DIAGNOSIS — B9689 Other specified bacterial agents as the cause of diseases classified elsewhere: Secondary | ICD-10-CM | POA: Diagnosis not present

## 2022-04-07 DIAGNOSIS — H66002 Acute suppurative otitis media without spontaneous rupture of ear drum, left ear: Secondary | ICD-10-CM | POA: Diagnosis not present

## 2022-04-07 DIAGNOSIS — J019 Acute sinusitis, unspecified: Secondary | ICD-10-CM | POA: Diagnosis not present

## 2022-04-07 DIAGNOSIS — Z03818 Encounter for observation for suspected exposure to other biological agents ruled out: Secondary | ICD-10-CM | POA: Diagnosis not present

## 2022-04-12 DIAGNOSIS — M5416 Radiculopathy, lumbar region: Secondary | ICD-10-CM | POA: Diagnosis not present

## 2022-04-26 DIAGNOSIS — M6281 Muscle weakness (generalized): Secondary | ICD-10-CM | POA: Diagnosis not present

## 2022-04-26 DIAGNOSIS — M5416 Radiculopathy, lumbar region: Secondary | ICD-10-CM | POA: Diagnosis not present

## 2022-05-16 DIAGNOSIS — M5416 Radiculopathy, lumbar region: Secondary | ICD-10-CM | POA: Diagnosis not present

## 2022-05-27 DIAGNOSIS — M5416 Radiculopathy, lumbar region: Secondary | ICD-10-CM | POA: Diagnosis not present

## 2022-06-13 DIAGNOSIS — Z1272 Encounter for screening for malignant neoplasm of vagina: Secondary | ICD-10-CM | POA: Diagnosis not present

## 2022-06-13 DIAGNOSIS — N3281 Overactive bladder: Secondary | ICD-10-CM | POA: Diagnosis not present

## 2022-06-13 DIAGNOSIS — Z01419 Encounter for gynecological examination (general) (routine) without abnormal findings: Secondary | ICD-10-CM | POA: Diagnosis not present

## 2022-06-13 DIAGNOSIS — N3941 Urge incontinence: Secondary | ICD-10-CM | POA: Diagnosis not present

## 2022-06-13 DIAGNOSIS — N891 Moderate vaginal dysplasia: Secondary | ICD-10-CM | POA: Diagnosis not present

## 2022-06-14 DIAGNOSIS — M5416 Radiculopathy, lumbar region: Secondary | ICD-10-CM | POA: Diagnosis not present

## 2022-06-20 DIAGNOSIS — M5416 Radiculopathy, lumbar region: Secondary | ICD-10-CM | POA: Diagnosis not present

## 2022-06-28 DIAGNOSIS — M5416 Radiculopathy, lumbar region: Secondary | ICD-10-CM | POA: Diagnosis not present

## 2022-07-05 ENCOUNTER — Ambulatory Visit
Admission: RE | Admit: 2022-07-05 | Discharge: 2022-07-05 | Disposition: A | Discharge status: Home / Self Care | Payer: 59 | Source: Ambulatory Visit | Attending: Family Medicine | Admitting: Family Medicine

## 2022-07-05 ENCOUNTER — Other Ambulatory Visit: Payer: 59

## 2022-07-05 ENCOUNTER — Other Ambulatory Visit: Payer: Self-pay | Admitting: Family Medicine

## 2022-07-05 DIAGNOSIS — R922 Inconclusive mammogram: Secondary | ICD-10-CM | POA: Diagnosis not present

## 2022-07-05 DIAGNOSIS — N6310 Unspecified lump in the right breast, unspecified quadrant: Secondary | ICD-10-CM

## 2022-07-05 DIAGNOSIS — N6320 Unspecified lump in the left breast, unspecified quadrant: Secondary | ICD-10-CM

## 2022-07-05 DIAGNOSIS — N6489 Other specified disorders of breast: Secondary | ICD-10-CM | POA: Diagnosis not present

## 2022-07-05 DIAGNOSIS — M5416 Radiculopathy, lumbar region: Secondary | ICD-10-CM | POA: Diagnosis not present

## 2022-07-11 DIAGNOSIS — M5416 Radiculopathy, lumbar region: Secondary | ICD-10-CM | POA: Diagnosis not present

## 2022-07-25 DIAGNOSIS — M5416 Radiculopathy, lumbar region: Secondary | ICD-10-CM | POA: Diagnosis not present

## 2022-08-15 DIAGNOSIS — M1712 Unilateral primary osteoarthritis, left knee: Secondary | ICD-10-CM | POA: Diagnosis not present

## 2022-08-15 DIAGNOSIS — M5416 Radiculopathy, lumbar region: Secondary | ICD-10-CM | POA: Diagnosis not present

## 2022-08-20 ENCOUNTER — Ambulatory Visit: Payer: 59 | Attending: Obstetrics and Gynecology

## 2022-08-20 DIAGNOSIS — M6289 Other specified disorders of muscle: Secondary | ICD-10-CM | POA: Diagnosis not present

## 2022-08-20 DIAGNOSIS — M6281 Muscle weakness (generalized): Secondary | ICD-10-CM | POA: Diagnosis not present

## 2022-08-20 DIAGNOSIS — R278 Other lack of coordination: Secondary | ICD-10-CM | POA: Insufficient documentation

## 2022-08-20 DIAGNOSIS — M545 Low back pain, unspecified: Secondary | ICD-10-CM | POA: Diagnosis not present

## 2022-08-20 NOTE — Therapy (Signed)
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   Past Surgical History:  Procedure Laterality Date   ABDOMINAL HYSTERECTOMY     CHOLECYSTECTOMY     Patient Active Problem List   Diagnosis Date Noted   S/P cholecystectomy 12/25/2012   Hepatic hemangioma 12/25/2012   S/P hysterectomy 12/25/2012   Rubella during pregnancy 12/25/2012   H/O mumps 12/25/2012   History of chicken pox 12/25/2012    PCP: [place] Pink, MD  REFERRING PROVIDER: Benjaman Kindler, MD   REFERRING DIAG:  N32.81 (ICD-10-CM) - Overactive bladder  N39.41 (ICD-10-CM) - Urge incontinence   THERAPY DIAG:  Pelvic floor dysfunction  Other lack of coordination  Bilateral low back pain, unspecified chronicity, unspecified whether sciatica present  Muscle weakness (generalized)  Rationale for Evaluation and Treatment: Rehabilitation  ONSET DATE: 1-2 years   RED FLAGS: N/A  Have you had any night sweats? Unexplained weight loss?  Saddle anesthesia? Unexplained changes in bowel or bladder habits?   SUBJECTIVE: Patient confirms identification and approves PT to assess pelvic floor and treatment Yes                                                                                                                                                                                            PRECAUTIONS: None  WEIGHT BEARING RESTRICTIONS: No  FALLS:  Has patient fallen in last 6 months? Yes. Number of falls 1 - slipped on wet ground and landed on bottom/back   OCCUPATION/SOCIAL ACTIVITIES: Clean houses/restaurant kitchen/cashier at [employer]   PLOF: Independent    LIVING ENVIRONMENT: Lives with: lives with their spouse Lives in: House/apartment    CHIEF CONCERN: Pt has a hx of lower back pain with L knee pain, N/T as well. Pt was  referred due to having urinary leakage and urge incontinence problems. Pt also mentions inability to have penetrative sex due to pain. Pt currently having some cramping feeling in the lower abdomen but feels it is related to what she eats as Pt has had her gallbladder removed.    PAIN:  Are you having pain? No NPRS scale: 0/10    PATIENT GOALS: Pt would like to be able to have more control over her bowels/bladder, no pain during penetrative sex or annual GYN exams, also less pain in the low back    UROLOGICAL HISTORY Fluid intake: water, chamomile tea Pain with urination: No Fully empty bladder: Yes  Stream: Strong Urgency: Yes Frequency: ~14x  Nocturia: 2x Leakage: Urge to void, Walking to the bathroom, Sneezing, Laughing, and Intercourse Pads: No Bladder control (0-10): 7/10   GASTROINTESTINAL HISTORY Pain with bowel movement: No Type of bowel movement:Type (Bristol Stool Scale) 6 and 7 and Frequency 2x  on average Fully empty rectum: No    SEXUAL HISTORY/FUNCTION Pain with intercourse: During Penetration and Pain Interrupts Intercourse  Ability to have vaginal penetration:  No, due to pain; Deep thrusting: No Able to achieve orgasm?: No   OBSTETRICAL HISTORY Vaginal deliveries: G3P3 Tearing: tearing with 2nd child  C-section deliveries: 1   GYNECOLOGICAL HISTORY Hysterectomy: yes, abdominal, still has ovaries  Pelvic Organ Prolapse: None Pain  with exam: yes and with vaginal u/s as well  Heaviness/pressure: no    OBJECTIVE:    COGNITION: Overall cognitive status: Within functional limits for tasks assessed     POSTURE:  Grossly in seated B rounded shoulders Lumbar lordosis:   Thoracic kyphosis: Deferred 2/2 time constraints Iliac crest height:  Lumbar lateral shift:  Pelvic obliquity:  Leg length discrepancy:   GAIT: Deferred 2/2 time constraints Distance walked:  Comments:   Trendelenburg:   SENSATION: Deferred 2/2 time constraints Light touch: , L2-S2 dermatomes  Proprioception:    RANGE OF MOTION:  Deferred 2/2 time constraints  (Norm range in degrees)  LEFT  RIGHT   Lumbar forward flexion (65):      Lumbar extension (30):     Lumbar lateral flexion (25):     Thoracic and Lumbar rotation (30 degrees):       Hip Flexion (0-125):      Hip IR (0-45):     Hip ER (0-45):     Hip Adduction:      Hip Abduction (0-40):     Hip extension (0-15):     (*= pain, Blank rows = not tested)   STRENGTH: MMT  Deferred 2/2 time constraints  RLE  LLE   Hip Flexion    Hip Extension    Hip Abduction     Hip Adduction     Hip ER     Hip IR     Knee Extension    Knee Flexion    Dorsiflexion     Plantarflexion (seated)    (*= pain, Blank rows = not tested)   SPECIAL TESTS: Deferred 2/2 time constraints Centralization and Peripheralization (SN 92, -LR 0.12):  Slump (SN 83, -LR 0.32):  SLR (SN 92, -LR 0.29): R: Lumbar quadrant (SN 70): R:  FABER (SN 81): FADIR (SN 94):  Hip scour (SN 50):  Thigh Thrust (SN 88, -LR 0.18) : Distraction (NF62):  Compression (SN/SP 69): Stork/March (SP 93):   PALPATION: Deferred 2/2 time constraints Abdominal:  Diastasis:  finger above umbilicus,  fingers at and below umbilicus  Scar mobility: present/mobile perpendicular, parallel  Rib flare: present/absent  EXTERNAL PELVIC EXAM: Patient educated on the purpose of the pelvic exam and articulated understanding;  patient consented to the exam verbally. Deferred 2/2 time constraints Palpation: Breath coordination: present/absent/inconsistent Voluntary Contraction: present/absent Relaxation: full/delayed/non-relaxing Perineal movement with sustained IAP increase ("bear down"): descent/no change/elevation/excessive descent Perineal movement with rapid IAP increase ("cough"): elevation/no change/descent Pubic symphysis: (0= no contraction, 1= flicker, 2= weak squeeze, 3= fair squeeze with lift, 4= good squeeze and lift against resistance, 5= strong squeeze against strong resistance)   INTERNAL PELVIC EXAM: Patient educated on the purpose of the pelvic exam and articulated understanding; patient consented to the exam verbally. Deferred 2/2 to time constraints Introitus Appears:  Skin integrity:  Scar mobility: Strength (PERF):  Symmetry: Palpation: Prolapse: (0= no contraction, 1= flicker, 2= weak squeeze, 3= fair squeeze with lift, 4= good squeeze and lift against resistance, 5= strong squeeze against strong resistance)    Patient Education:  Patient educated on what to expect during course of physical therapy, POC, and provided with HEP including: discussion on scar massage, will give handout next session. Patient verbalized understanding and returned demonstration. Patient will benefit from further education in order to maximize compliance and understanding for long-term therapeutic gains.    Patient Surveys:  FOTO Urinary Problem - 62     ASSESSMENT:  Clinical Impression: Patient is a 56 y.o. who was seen today for physical therapy evaluation and treatment for a chief concern of urge incontinence and pelvic pain. Today's evaluation suggest deficits in IAP management, PFM coordination, PFM strength, PFM extensibility, posture, pain, and scar mobility as evidenced by hx of LBP which is intermittent (none currently), urinary leakage with coughing/sneezing/laughing/intercourse, urinary urgency  and frequency (~14x/day), nocturia (up to 2x), in seated B rounded shoulders, feeling of incomplete emptying of bowels, Type 6 and 7 (Bristol Stool Chart) average BMs with occasional increased frequency up to 4x/day, increased pelvic pain which radiates towards lower abdomen during penetrative sex, hx of a c-section with perineal tears, inability to participate in intercourse, and increased pelvic pain with annual GYN exams/vaginal u/s. Patient's responses on FOTO Urinary Problem (62) indicates moderate limitation/disability/distress. Patient's progress may be limited due to time since onset; however, patient's motivation is advantageous. Pt with basic understanding of PFM function in bowel/bladder habits, sexual function, posture, and the deep core. Patient will benefit from skilled therapeutic intervention to address deficits in IAP management, PFM coordination, PFM strength, PFM extensibility, posture, pain, and scar mobility in order to increase PLOF and improve overall QOL.    Objective Impairments: decreased coordination, decreased endurance, decreased strength, increased fascial restrictions, improper body mechanics, postural dysfunction, and pain.   Activity Limitations: lifting, bending, standing, continence, and locomotion level  Personal Factors: Age, Behavior pattern, Past/current experiences, Time since onset of injury/illness/exacerbation, and 1 comorbidity: hx of LBP and L knee pain  are also affecting patient's functional outcome.   Rehab Potential: Good  Clinical Decision Making: Evolving/moderate complexity  Evaluation Complexity: Moderate   GOALS: Goals reviewed with patient? Yes  SHORT TERM GOALS: Target date: 09/24/2022  Patient will demonstrate independence with HEP in order to maximize therapeutic gains and improve carryover from physical therapy sessions to ADLs in the home and community. Baseline: scar massage discussion Goal status: INITIAL   LONG TERM GOALS:  Target date: 10/29/2022   Patient will score  >/= 69 on FOTO Urinary Problem and present with a 5 point change in FOTO Lumbar Spine  in order to demonstrate decreased pain, improved PFM coordination, improved  IAP management and overall QOL.  Baseline: 62 Goal status: INITIAL  2.  Patient will report less than 5 incidents of stress urinary incontinence over the course of 3 weeks while coughing/sneezing/laughing/intercourse in order to demonstrate improved PFM coordination, strength, and function for improved overall QOL. Baseline: urinary leakage with all the above Goal status: INITIAL  3.  Patient will report being able to return to activities including, but not limited to: penetrative sex, annual GYN exams, and diagnostic imaging (transvaginal u/s) without pain or limitation to indicate complete resolution of the chief concern and return to prior level of participation at home and in the community. Baseline: inability to participate in penetrative sex, 10/10 pain with annual GYN exams/diagnostic imaging Goal status: INITIAL  4.  Patient will demonstrate circumferential and sequential contraction of >3/5 MMT, > 5 sec hold x5 and 5 consecutive quick flicks with </= 10 min rest between testing bouts, and relaxation of the PFM coordinated with breath for improved management of intra-abdominal pressure and normal bowel and bladder function without the presence of pain nor incontinence in order to improve participation at home and in the community. Baseline: will assess next visit  Goal status: INITIAL  5.  Patient will demonstrate independent and coordinated diaphragmatic breathing in supine with a 1:2 breathing pattern for improved down-regulation of the nervous system and improved management of intra-abdominal pressures in order to increase function at home and in the community. Baseline: will assess next visit  Goal status: INITIAL    PLAN: PT Frequency: 1x/week  PT Duration: 10  weeks  Planned Interventions: Therapeutic exercises, Therapeutic activity, Neuromuscular re-education, Balance training, Gait training, Patient/Family education, Self Care, Joint mobilization, Spinal mobilization, Cryotherapy, Moist heat, scar mobilization, Taping, and Manual therapy  Plan For Next Session: phy assess, FOTO Lumbar spine   Abeni Finchum, PT, DPT  08/20/2022, 9:57 AM

## 2022-08-21 ENCOUNTER — Other Ambulatory Visit: Payer: Self-pay | Admitting: Orthopedic Surgery

## 2022-08-21 DIAGNOSIS — M5416 Radiculopathy, lumbar region: Secondary | ICD-10-CM

## 2022-08-21 DIAGNOSIS — M1712 Unilateral primary osteoarthritis, left knee: Secondary | ICD-10-CM

## 2022-08-28 ENCOUNTER — Ambulatory Visit: Payer: 59

## 2022-08-28 DIAGNOSIS — M545 Low back pain, unspecified: Secondary | ICD-10-CM | POA: Diagnosis not present

## 2022-08-28 DIAGNOSIS — M6281 Muscle weakness (generalized): Secondary | ICD-10-CM | POA: Diagnosis not present

## 2022-08-28 DIAGNOSIS — M6289 Other specified disorders of muscle: Secondary | ICD-10-CM

## 2022-08-28 DIAGNOSIS — R278 Other lack of coordination: Secondary | ICD-10-CM

## 2022-08-28 NOTE — Therapy (Signed)
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   Past Surgical History:  Procedure Laterality Date   ABDOMINAL HYSTERECTOMY     CHOLECYSTECTOMY     Patient Active Problem List   Diagnosis Date Noted   S/P cholecystectomy 12/25/2012   Hepatic hemangioma 12/25/2012   S/P hysterectomy 12/25/2012   Rubella during pregnancy 12/25/2012   H/O mumps 12/25/2012   History of chicken pox 12/25/2012    PCP: [place] Pink, MD  REFERRING PROVIDER: Benjaman Kindler, MD   REFERRING DIAG:  N32.81 (ICD-10-CM) - Overactive bladder  N39.41 (ICD-10-CM) - Urge incontinence   THERAPY DIAG:  Pelvic floor dysfunction  Other lack of coordination  Bilateral low back pain, unspecified chronicity, unspecified whether sciatica present  Muscle weakness (generalized)  Rationale for Evaluation and Treatment: Rehabilitation  ONSET DATE: 1-2 years   PRECAUTIONS: None  WEIGHT BEARING RESTRICTIONS: No  FALLS:  Has patient fallen in last 6 months? Yes. 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 Pt was referred due to having urinary leakage and urge incontinence problems. Pt also mentions inability to have penetrative sex due to pain. Pt currently having some cramping feeling in the lower abdomen but feels it is related to what she eats as Pt has had her gallbladder removed.    PATIENT GOALS: Pt would like to be able to have more control over her bowels/bladder, no pain during penetrative sex or annual GYN exams, also less pain in the low back    UROLOGICAL HISTORY Fluid intake: water, chamomile tea Pain with urination: No Fully empty bladder: Yes  Stream: Strong Urgency: Yes Frequency: ~14x  Nocturia: 2x Leakage: Urge to void, Walking to the bathroom, Sneezing, Laughing, and Intercourse Pads: No Bladder control (0-10): 7/10   GASTROINTESTINAL HISTORY Pain with bowel movement: No Type of bowel movement:Type (Bristol Stool Scale) 6 and 7 and Frequency 2x  on average Fully empty rectum: No    SEXUAL HISTORY/FUNCTION Pain with intercourse: During Penetration and Pain Interrupts Intercourse  Ability to have vaginal penetration:  No, due to pain; Deep thrusting: No Able to achieve orgasm?: No   OBSTETRICAL HISTORY Vaginal deliveries: G3P3 Tearing: tearing with 2nd child  C-section deliveries: 1   GYNECOLOGICAL HISTORY Hysterectomy: yes, abdominal, still has ovaries  Pelvic Organ Prolapse: None Pain with exam: yes and with vaginal u/s as well  Heaviness/pressure: no    SUBJECTIVE:  Patient  reports no changes from initial eval.   PAIN:  Are you having pain? No NPRS scale: 0/10   TODAY'S TREATMENT  Neuromuscular Reeducation:  Pre-treatment assessment   OBJECTIVE:    COGNITION: Overall cognitive status: Within functional limits for tasks assessed     POSTURE:  Grossly in seated B rounded shoulders Iliac crest height: L iliac crest higher, L shoulder elevated Pelvic obliquity: WNL   SENSATION:  Light touch: intact, L2-S2 dermatomes      RANGE OF MOTION:    (Norm range in degrees)  LEFT 08/28/22 RIGHT 08/28/22  Lumbar forward flexion (65):  WNL    Lumbar extension (30): WNL    Lumbar lateral flexion (25):  Restricted  WNL  Thoracic and Lumbar rotation (30 degrees):    Restricted WNL   Hip Flexion (0-125):   WNL WNL  Hip IR (0-45):  WNL WNL  Hip ER (0-45):  WNL WNL  Hip Adduction:      Hip Abduction (0-40):  WNL WNL  Hip extension (0-15):     (*= pain, Blank rows = not tested)   STRENGTH: MMT    RLE 08/28/22 LLE 08/28/22  Hip Flexion 5 4  Hip Extension 5 5  Hip Abduction     Hip Adduction     Hip ER  5 5  Hip IR  5 5  Knee Extension 5 5  Knee Flexion 5 5  Dorsiflexion     Plantarflexion (seated) 5 5  (*= pain, Blank rows = not tested)   SPECIAL TESTS:   FABER (SN 81): negative B FADIR (SN 94): negative B    PALPATION:  Abdominal:  Scar mobility: c-section but will assess more next session Rib flare: present B  -muscular tension upon palpation with increased tenderness in center of abdomen and L quadrant by umbilicus  -increased fascial restriction noted possibly from abdominal hysterectomy   EXTERNAL PELVIC EXAM: Patient educated on the purpose of the pelvic exam and articulated understanding; patient consented to the exam verbally. Deferred 2/2 time constraints Palpation: Breath coordination: present/absent/inconsistent Voluntary Contraction: present/absent Relaxation: full/delayed/non-relaxing Perineal movement with sustained IAP increase ("bear down"): descent/no change/elevation/excessive descent Perineal movement with rapid IAP increase ("cough"): elevation/no change/descent Pubic symphysis: (0= no contraction, 1= flicker, 2= weak squeeze, 3= fair squeeze with lift, 4= good squeeze and lift against resistance, 5= strong squeeze against strong resistance)   Manual Therapy: Abdominal myofascial release for improved fascial sling mobility and pain modulation  Initially increased  tenderness along center of abdomen and increased fascial restriction noted at L quadrant  Improved with increased time    Neuromuscular Re-education: Time taken to fill out FOTO Lumbar Spine to establish baseline  Supine hooklying diaphragmatic breathing with VCs and TCs for downregulation of the nervous system and improved management of IAP  Seated diaphragmatic breathing with VCs and TCs for downregulation of the nervous system and improved management of IAP Pt found more difficult and required VCs and TCs   Patient response to interventions: Pt felt a release at her abdomen at end of session    Patient Education:  Patient provided with HEP including: supine and seated diaphragmatic breathing. Patient verbalized understanding and returned demonstration. Patient will benefit from further education in order to maximize compliance and understanding for long-term therapeutic gains.    Patient Surveys:  FOTO Lumbar Spine - 72    ASSESSMENT:  Clinical Impression: Patient presents to clinic with excellent motivation to participate in today's session. Upon physical examination, Pt demonstrates deficits in  ROM, LE strength, IAP management, posture, pain, and scar mobility as evidenced by increased L iliac crest height, L shoulder elevation and B rounded shoulders in sitting, 4/5 MMT of R hip flexion, restricted ROM L thoracic/lumbar rotation and L lateral flexion, B rib flare, and tenderness/pain upon palpation of the abdomen at the center and at L quadrant towards umbilicus. Will assess PFM external exam next session. Pt required moderate VCs and TCs during diaphragmatic breathing in various positions to decrease chest excursion and allow for optimal expansion of the ribs and abdomen. Pt reports feeling less tension in the abdominal area at end of session. Pt responded positively to manual, active, and educational interventions. Patient's responses on FOTO Lumbar Spine (72) indicates minimal  limitation/disability/distress. Patient will continue to benefit from skilled therapeutic intervention to address deficits in ROM, LE strength, IAP management, PFM coordination, PFM strength, PFM extensibility, posture, pain, and scar mobility in order to increase PLOF and improve overall QOL.    Objective Impairments: decreased coordination, decreased endurance, decreased strength, increased fascial restrictions, improper body mechanics, postural dysfunction, and pain.   Activity Limitations: lifting, bending, standing, continence, and locomotion level  Personal Factors: Age, Behavior pattern, Past/current experiences, Time since onset of injury/illness/exacerbation, and 1 comorbidity: hx of LBP and L knee pain  are also affecting patient's functional outcome.   Rehab Potential: Good  Clinical Decision Making: Evolving/moderate complexity  Evaluation Complexity: Moderate   GOALS: Goals reviewed with patient? Yes  SHORT TERM GOALS: Target date: 09/24/2022  Patient will demonstrate independence with HEP in order to maximize therapeutic gains and improve carryover from physical therapy sessions to ADLs in the home and community. Baseline: scar massage discussion Goal status: INITIAL   LONG TERM GOALS: Target date: 10/29/2022   Patient will score  >/= 69 on FOTO Urinary Problem and present with a 5 point change in FOTO Lumbar Spine  in order to demonstrate decreased pain, improved PFM coordination, improved IAP management and overall QOL.  Baseline: 62, 72 (lumbar) Goal status: INITIAL  2.  Patient will report less than 5 incidents of stress urinary incontinence over the course of 3 weeks while coughing/sneezing/laughing/intercourse in order to demonstrate improved PFM coordination, strength, and function for improved overall QOL. Baseline: urinary leakage with all the above Goal status: INITIAL  3.  Patient will report being able to return to activities including, but not limited  to: penetrative sex, annual GYN exams, and diagnostic imaging (transvaginal u/s) without pain or limitation to indicate complete resolution of the chief concern and return to prior level of participation at home and in the community. Baseline: inability to participate in penetrative sex, 10/10 pain with annual GYN exams/diagnostic imaging Goal status: INITIAL  4.  Patient will demonstrate circumferential and sequential contraction of >3/5 MMT, > 5 sec hold x5 and 5 consecutive quick flicks with </= 10 min rest between testing bouts, and relaxation of the PFM coordinated with breath for improved management of intra-abdominal pressure and normal bowel and bladder function without the presence of pain nor incontinence in order to improve participation at home and in the community. Baseline: will assess next visit  Goal status: INITIAL  5.  Patient will demonstrate independent and coordinated diaphragmatic breathing in supine with a 1:2 breathing pattern for improved down-regulation of the nervous system and improved management of intra-abdominal pressures in order to increase function at home and in the community. Baseline: increased chest excursion in supine with minimal abdominal rise Goal status: INITIAL    PLAN: PT  Frequency: 1x/week  PT Duration: 10 weeks  Planned Interventions: Therapeutic exercises, Therapeutic activity, Neuromuscular re-education, Balance training, Gait training, Patient/Family education, Self Care, Joint mobilization, Spinal mobilization, Cryotherapy, Moist heat, scar mobilization, Taping, and Manual therapy  Plan For Next Session:  manual/look at scar, PFM external, thoracic rotation or emphasize scar massage   Sahand Gosch, PT, DPT  08/28/2022, 8:41 AM

## 2022-09-06 ENCOUNTER — Ambulatory Visit: Payer: 59

## 2022-09-06 DIAGNOSIS — M6289 Other specified disorders of muscle: Secondary | ICD-10-CM | POA: Diagnosis not present

## 2022-09-06 DIAGNOSIS — M6281 Muscle weakness (generalized): Secondary | ICD-10-CM | POA: Diagnosis not present

## 2022-09-06 DIAGNOSIS — M545 Low back pain, unspecified: Secondary | ICD-10-CM

## 2022-09-06 DIAGNOSIS — R278 Other lack of coordination: Secondary | ICD-10-CM

## 2022-09-06 NOTE — Therapy (Signed)
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   Past Surgical History:  Procedure Laterality Date   ABDOMINAL HYSTERECTOMY     CHOLECYSTECTOMY     Patient Active Problem List   Diagnosis Date Noted   S/P cholecystectomy 12/25/2012   Hepatic hemangioma 12/25/2012   S/P hysterectomy 12/25/2012   Rubella during pregnancy 12/25/2012   H/O mumps 12/25/2012   History of chicken pox 12/25/2012    PCP: Maryland Pink, MD  REFERRING PROVIDER: Benjaman Kindler, MD   REFERRING DIAG:  N32.81 (ICD-10-CM) - Overactive bladder  N39.41 (ICD-10-CM) - Urge incontinence   THERAPY DIAG:  Pelvic floor dysfunction  Other lack of coordination  Bilateral low back pain, unspecified chronicity, unspecified whether sciatica present  Muscle weakness (generalized)  Rationale for Evaluation and Treatment: Rehabilitation  ONSET DATE: 1-2 years   PRECAUTIONS: None  WEIGHT BEARING RESTRICTIONS: No  FALLS:  Has patient fallen in last 6 months? Yes. 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 Pt was referred due to having urinary leakage and urge incontinence problems. Pt also mentions inability to have penetrative sex due to pain. Pt currently having some cramping feeling in the lower abdomen but feels it is related to what she eats as Pt has had her gallbladder removed.    PATIENT GOALS: Pt would like to be able to have more control over her bowels/bladder, no pain during penetrative sex or annual GYN exams, also less pain in the low back    UROLOGICAL HISTORY Fluid intake: water, chamomile tea Pain with urination: No Fully empty bladder: Yes  Stream: Strong Urgency: Yes Frequency: ~14x  Nocturia: 2x Leakage: Urge to void, Walking to the bathroom, Sneezing, Laughing, and Intercourse Pads: No Bladder control (0-10): 7/10   GASTROINTESTINAL HISTORY Pain with bowel movement: No Type of bowel movement:Type (Bristol Stool Scale) 6 and 7 and Frequency 2x  on average Fully empty rectum: No    SEXUAL HISTORY/FUNCTION Pain with intercourse: During Penetration and Pain Interrupts Intercourse  Ability to have vaginal penetration:  No, due to pain; Deep thrusting: No Able to achieve orgasm?: No   OBSTETRICAL HISTORY Vaginal deliveries: G3P3 Tearing: tearing with 2nd child  C-section deliveries: 1   GYNECOLOGICAL HISTORY Hysterectomy: yes, abdominal, still has ovaries  Pelvic Organ Prolapse: None Pain with exam: yes and with vaginal u/s as well  Heaviness/pressure: no    SUBJECTIVE:  Patient  reports some pain in the upper back from this morning from cooking/picking up dishes. Believes it is a muscle strain as this has happened in the past and she took a Meloxicam.    PAIN:  Are you having pain? Yes NPRS scale: 8/10, upper thoracic posterior     OBJECTIVE:    COGNITION: Overall cognitive status: Within functional limits for tasks assessed     POSTURE:  Grossly in seated B rounded shoulders Iliac crest height: L iliac crest higher, L shoulder  elevated Pelvic obliquity: WNL   SENSATION:  Light touch: intact, L2-S2 dermatomes     RANGE OF MOTION:    (Norm range in degrees)  LEFT 08/28/22 RIGHT 08/28/22  Lumbar forward flexion (65):  WNL    Lumbar extension (30): WNL    Lumbar lateral flexion (25):  Restricted  WNL  Thoracic and Lumbar rotation (30 degrees):    Restricted WNL   Hip Flexion (0-125):   WNL WNL  Hip IR (0-45):  WNL WNL  Hip ER (0-45):  WNL WNL  Hip Adduction:      Hip Abduction (0-40):  WNL WNL  Hip extension (0-15):     (*= pain, Blank rows = not tested)   STRENGTH: MMT    RLE 08/28/22 LLE 08/28/22  Hip Flexion 5 4  Hip Extension 5 5  Hip Abduction     Hip Adduction     Hip ER  5 5  Hip IR  5 5  Knee Extension 5 5  Knee Flexion 5 5  Dorsiflexion     Plantarflexion (seated) 5 5  (*= pain, Blank rows = not tested)   SPECIAL TESTS:   FABER (SN 81): negative B FADIR (SN 94): negative B    PALPATION:  Abdominal:  Scar mobility: c-section but will assess more next session Rib flare: present B  -muscular tension upon palpation with increased tenderness in center of abdomen and L quadrant by umbilicus  -increased fascial restriction noted possibly from abdominal hysterectomy    TODAY'S TREATMENT  Neuromuscular Re-education:  Pre-treatment assessment  EXTERNAL PELVIC EXAM: Patient educated on the purpose of the pelvic exam and articulated understanding; patient consented to the exam verbally.  Breath coordination: present  Voluntary Contraction: present, 3/5 MMT with Valsalva Relaxation: full Perineal movement with sustained IAP increase ("bear down"): no change with Valsalva  Perineal movement with rapid IAP increase ("cough"): no change  (0= no contraction, 1= flicker, 2= weak squeeze, 3= fair squeeze with lift, 4= good squeeze and lift against resistance, 5= strong squeeze against strong resistance)  Supine hooklying diaphragmatic breathing with VCs and TCs for downregulation of  the nervous system and improved management of IAP  Discussion on proper body mechanics in standing while Pt resting on heat modality. Decreasing torque on the body (no twisting) and breathing through movement (exhale with movement). Pt verbalized understanding.   Discussion on toileting posture especially with a BM in order to avoid hemorrhoids and straining (increase PFM tension)   Supine hooklying PFM lengthening techniques with diaphragmatic breathing, VCs and TCs as needed              Childs pose   Cat/cow   Right sidelying thoracic rotations, x10, for improved lengthening of the anterior fascial slings   Discussion and demonstration on log roll technique for improved IAP management and to decrease lower back pain.   Patient response to interventions: Pt with 4/10 upper back pain at end of session     Patient  Education:  Patient provided with HEP including: cat/cow, thoracic rotation, child's pose. Patient educated throughout session on appropriate technique and form using multi-modal cueing, HEP, and activity modification. Patient will benefit from further education in order to maximize compliance and understanding for long-term therapeutic gains.    ASSESSMENT:  Clinical Impression: Patient presents to clinic with excellent motivation to participate in today's session. Pt continues to demonstrate deficits in ROM, LE strength, IAP management, posture, pain, and scar mobility. Upon PFM external exam, Pt demonstrates deficits in PFM coordination, PFM strength, and PFM extensibility as evidenced by 2/5 MMT with observed Valsalva maneuver and no change of PFM with sustained IAP increase ("bear down") with Valsalva. Pt did arrive with 8/10 upper thoracic back pain from possible improper body mechanics this morning while cooking. After heat modality and PFM lengthening/pain modulation at the lower back, Pt ended session with 4/10 pain. Pt did require moderate VCs and TCs during active  interventions for proper technique and to decrease bodily compensations. Pt responded positively to active and educational interventions.  Patient will continue to benefit from skilled therapeutic intervention to address deficits in ROM, LE strength, IAP management, PFM coordination, PFM strength, PFM extensibility, posture, pain, and scar mobility in order to increase PLOF and improve overall QOL.    Objective Impairments: decreased coordination, decreased endurance, decreased strength, increased fascial restrictions, improper body mechanics, postural dysfunction, and pain.   Activity Limitations: lifting, bending, standing, continence, and locomotion level  Personal Factors: Age, Behavior pattern, Past/current experiences, Time since onset of injury/illness/exacerbation, and 1 comorbidity: hx of LBP and L knee pain  are also affecting patient's functional outcome.   Rehab Potential: Good  Clinical Decision Making: Evolving/moderate complexity  Evaluation Complexity: Moderate   GOALS: Goals reviewed with patient? Yes  SHORT TERM GOALS: Target date: 09/24/2022  Patient will demonstrate independence with HEP in order to maximize therapeutic gains and improve carryover from physical therapy sessions to ADLs in the home and community. Baseline: scar massage discussion Goal status: INITIAL   LONG TERM GOALS: Target date: 10/29/2022   Patient will score  >/= 69 on FOTO Urinary Problem and present with a 5 point change in FOTO Lumbar Spine  in order to demonstrate decreased pain, improved PFM coordination, improved IAP management and overall QOL.  Baseline: 62, 72 (lumbar) Goal status: INITIAL  2.  Patient will report less than 5 incidents of stress urinary incontinence over the course of 3 weeks while coughing/sneezing/laughing/intercourse in order to demonstrate improved PFM coordination, strength, and function for improved overall QOL. Baseline: urinary leakage with all the above Goal  status: INITIAL  3.  Patient will report being able to return to activities including, but not limited to: penetrative sex, annual GYN exams, and diagnostic imaging (transvaginal u/s) without pain or limitation to indicate complete resolution of the chief concern and return to prior level of participation at home and in the community. Baseline: inability to participate in penetrative sex, 10/10 pain with annual GYN exams/diagnostic imaging Goal status: INITIAL  4.  Patient will demonstrate circumferential and sequential contraction of >3/5 MMT, > 5 sec hold x5 and 5 consecutive quick flicks with </= 10 min rest between testing bouts, and relaxation of the PFM coordinated with breath for improved management of intra-abdominal pressure and normal bowel and bladder function without the presence of pain nor incontinence in order to improve participation at home and in the community. Baseline: will assess next visit  Goal status: INITIAL  5.  Patient will demonstrate independent  and coordinated diaphragmatic breathing in supine with a 1:2 breathing pattern for improved down-regulation of the nervous system and improved management of intra-abdominal pressures in order to increase function at home and in the community. Baseline: increased chest excursion in supine with minimal abdominal rise Goal status: INITIAL    PLAN: PT Frequency: 1x/week  PT Duration: 10 weeks  Planned Interventions: Therapeutic exercises, Therapeutic activity, Neuromuscular re-education, Balance training, Gait training, Patient/Family education, Self Care, Joint mobilization, Spinal mobilization, Cryotherapy, Moist heat, scar mobilization, Taping, and Manual therapy  Plan For Next Session:  how was the back? manual/look at scar, PFM lengthen/back pain modulation   [employer], PT, DPT  09/06/2022, 7:58 AM

## 2022-09-09 ENCOUNTER — Ambulatory Visit
Admission: RE | Admit: 2022-09-09 | Discharge: 2022-09-09 | Disposition: A | Discharge status: Home / Self Care | Payer: 59 | Source: Ambulatory Visit | Attending: Orthopedic Surgery | Admitting: Orthopedic Surgery

## 2022-09-09 DIAGNOSIS — R202 Paresthesia of skin: Secondary | ICD-10-CM | POA: Diagnosis not present

## 2022-09-09 DIAGNOSIS — M1712 Unilateral primary osteoarthritis, left knee: Secondary | ICD-10-CM

## 2022-09-09 DIAGNOSIS — M545 Low back pain, unspecified: Secondary | ICD-10-CM | POA: Diagnosis not present

## 2022-09-09 DIAGNOSIS — M5416 Radiculopathy, lumbar region: Secondary | ICD-10-CM

## 2022-09-11 ENCOUNTER — Ambulatory Visit: Payer: 59 | Attending: Obstetrics and Gynecology

## 2022-09-11 DIAGNOSIS — R278 Other lack of coordination: Secondary | ICD-10-CM | POA: Diagnosis not present

## 2022-09-11 DIAGNOSIS — M545 Low back pain, unspecified: Secondary | ICD-10-CM | POA: Diagnosis not present

## 2022-09-11 DIAGNOSIS — M6289 Other specified disorders of muscle: Secondary | ICD-10-CM | POA: Insufficient documentation

## 2022-09-11 DIAGNOSIS — M6281 Muscle weakness (generalized): Secondary | ICD-10-CM | POA: Diagnosis not present

## 2022-09-11 NOTE — Therapy (Signed)
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   Past Surgical History:  Procedure Laterality Date   ABDOMINAL HYSTERECTOMY     CHOLECYSTECTOMY     Patient Active Problem List   Diagnosis Date Noted   S/P cholecystectomy 12/25/2012   Hepatic hemangioma 12/25/2012   S/P hysterectomy 12/25/2012   Rubella during pregnancy 12/25/2012   H/O mumps 12/25/2012   History of chicken pox 12/25/2012    PCP: [place] Pink, MD  REFERRING PROVIDER: Benjaman Kindler, MD   REFERRING DIAG:  N32.81 (ICD-10-CM) - Overactive bladder  N39.41 (ICD-10-CM) - Urge incontinence   THERAPY DIAG:  Pelvic floor dysfunction  Other lack of coordination  Bilateral low back pain, unspecified chronicity, unspecified whether sciatica present  Muscle weakness (generalized)  Rationale for Evaluation and Treatment: Rehabilitation  ONSET DATE: 1-2 years   PRECAUTIONS: None  WEIGHT BEARING RESTRICTIONS: No  FALLS:  Has patient fallen in last 6 months? Yes. 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 Pt was referred due to having urinary leakage and urge incontinence problems. Pt also mentions inability to have penetrative sex due to pain. Pt currently having some cramping feeling in the lower abdomen but feels it is related to what she eats as Pt has had her gallbladder removed.    PATIENT GOALS: Pt would like to be able to have more control over her bowels/bladder, no pain during penetrative sex or annual GYN exams, also less pain in the low back    UROLOGICAL HISTORY Fluid intake: water, chamomile tea Pain with urination: No Fully empty bladder: Yes  Stream: Strong Urgency: Yes Frequency: ~14x  Nocturia: 2x Leakage: Urge to void, Walking to the bathroom, Sneezing, Laughing, and Intercourse Pads: No Bladder control (0-10): 7/10   GASTROINTESTINAL HISTORY Pain with bowel movement: No Type of bowel movement:Type (Bristol Stool Scale) 6 and 7 and Frequency 2x  on average Fully empty rectum: No    SEXUAL HISTORY/FUNCTION Pain with intercourse: During Penetration and Pain Interrupts Intercourse  Ability to have vaginal penetration:  No, due to pain; Deep thrusting: No Able to achieve orgasm?: No   OBSTETRICAL HISTORY Vaginal deliveries: G3P3 Tearing: tearing with 2nd child  C-section deliveries: 1   GYNECOLOGICAL HISTORY Hysterectomy: yes, abdominal, still has ovaries  Pelvic Organ Prolapse: None Pain with exam: yes and with vaginal u/s as well  Heaviness/pressure: no    SUBJECTIVE:  Patient  has been doing better with upper back pain from last session. Pt reports significant improvement in urinary leakage.    PAIN:  Are you having pain? No    OBJECTIVE:    COGNITION: Overall cognitive status: Within functional limits for tasks assessed     POSTURE:  Grossly in seated B rounded shoulders Iliac crest height: L iliac crest higher, L shoulder elevated Pelvic obliquity: WNL   SENSATION:  Light touch: intact, L2-S2 dermatomes     RANGE OF MOTION:     (Norm range in degrees)  LEFT 08/28/22 RIGHT 08/28/22  Lumbar forward flexion (65):  WNL    Lumbar extension (30): WNL    Lumbar lateral flexion (25):  Restricted  WNL  Thoracic and Lumbar rotation (30 degrees):    Restricted WNL   Hip Flexion (0-125):   WNL WNL  Hip IR (0-45):  WNL WNL  Hip ER (0-45):  WNL WNL  Hip Adduction:      Hip Abduction (0-40):  WNL WNL  Hip extension (0-15):     (*= pain, Blank rows = not tested)   STRENGTH: MMT    RLE 08/28/22 LLE 08/28/22  Hip Flexion 5 4  Hip Extension 5 5  Hip Abduction     Hip Adduction     Hip ER  5 5  Hip IR  5 5  Knee Extension 5 5  Knee Flexion 5 5  Dorsiflexion     Plantarflexion (seated) 5 5  (*= pain, Blank rows = not tested)   SPECIAL TESTS:   FABER (SN 81): negative B FADIR (SN 94): negative B    PALPATION:  Abdominal:  Scar mobility: c-section but will assess more next session Rib flare: present B  -muscular tension upon palpation with increased tenderness in center of abdomen and L quadrant by umbilicus  -increased fascial restriction noted possibly from abdominal hysterectomy   EXTERNAL PELVIC EXAM: Patient educated on the purpose of the pelvic exam and articulated understanding; patient consented to the exam verbally.  Breath coordination: present  Voluntary Contraction: present, 3/5 MMT with Valsalva Relaxation: full Perineal movement with sustained IAP increase ("bear down"): no change with Valsalva  Perineal movement with rapid IAP increase ("cough"): no change  (0= no contraction, 1= flicker, 2= weak squeeze, 3= fair squeeze with lift, 4= good squeeze and lift against resistance, 5= strong squeeze against strong resistance)   TODAY'S TREATMENT  Neuromuscular Re-education: Discussion on urinary urgency control and Bradley's loops (brain bladder connection) as Pt with increased urgency and some urinary leakage while walking to the bathroom. Pt verbalized understanding.   Pre-treatment  assessment:  POSTURE:  Iliac crest height: L iliac crest slightly higher but improved from last session, shoulder height equal  Supine hooklying diaphragmatic breathing with VCs and TCs for downregulation of the nervous system and improved management of IAP  Supine hooklying PFM lengthening techniques with diaphragmatic breathing, VCs and TCs as needed              B Single knee to chest   Double knee to chest              "Happy baby" pose   Butterfly pose "adductor stretch"   Piriformis pose (supine and seated)   Patient response to interventions: Pt enjoyed butterfly pose the most    Patient Education:  Patient provided with HEP including: PFM lengthening techniques, urinary urgency control handout. Patient educated throughout session on appropriate technique and form using multi-modal cueing, HEP, and activity  modification. Patient will benefit from further education in order to maximize compliance and understanding for long-term therapeutic gains.    ASSESSMENT:  Clinical Impression: Patient presents to clinic with excellent motivation to participate in today's session. Pt continues to demonstrate deficits in ROM, LE strength, IAP management, posture, pain, and scar mobility. Pt with improved upper thoracic pain from last session and pain modulation (cat/cow, childs pose) helped relieve the pain. Pt required moderate VCs and TCs during PFM lengthening/pain modulation techniques for proper technique. Pt with improved diaphragmatic breathing in supine. Pt responded positively to active and educational interventions. Patient will continue to benefit from skilled therapeutic intervention to address deficits in ROM, LE strength, IAP management, PFM coordination, PFM strength, PFM extensibility, posture, pain, and scar mobility in order to increase PLOF and improve overall QOL.    Objective Impairments: decreased coordination, decreased endurance, decreased strength, increased fascial  restrictions, improper body mechanics, postural dysfunction, and pain.   Activity Limitations: lifting, bending, standing, continence, and locomotion level  Personal Factors: Age, Behavior pattern, Past/current experiences, Time since onset of injury/illness/exacerbation, and 1 comorbidity: hx of LBP and L knee pain  are also affecting patient's functional outcome.   Rehab Potential: Good  Clinical Decision Making: Evolving/moderate complexity  Evaluation Complexity: Moderate   GOALS: Goals reviewed with patient? Yes  SHORT TERM GOALS: Target date: 09/24/2022  Patient will demonstrate independence with HEP in order to maximize therapeutic gains and improve carryover from physical therapy sessions to ADLs in the home and community. Baseline: scar massage discussion Goal status: INITIAL   LONG TERM GOALS: Target date: 10/29/2022   Patient will score  >/= 69 on FOTO Urinary Problem and present with a 5 point change in FOTO Lumbar Spine  in order to demonstrate decreased pain, improved PFM coordination, improved IAP management and overall QOL.  Baseline: 62, 72 (lumbar) Goal status: INITIAL  2.  Patient will report less than 5 incidents of stress urinary incontinence over the course of 3 weeks while coughing/sneezing/laughing/intercourse in order to demonstrate improved PFM coordination, strength, and function for improved overall QOL. Baseline: urinary leakage with all the above Goal status: INITIAL  3.  Patient will report being able to return to activities including, but not limited to: penetrative sex, annual GYN exams, and diagnostic imaging (transvaginal u/s) without pain or limitation to indicate complete resolution of the chief concern and return to prior level of participation at home and in the community. Baseline: inability to participate in penetrative sex, 10/10 pain with annual GYN exams/diagnostic imaging Goal status: INITIAL  4.  Patient will demonstrate  circumferential and sequential contraction of >3/5 MMT, > 5 sec hold x5 and 5 consecutive quick flicks with </= 10 min rest between testing bouts, and relaxation of the PFM coordinated with breath for improved management of intra-abdominal pressure and normal bowel and bladder function without the presence of pain nor incontinence in order to improve participation at home and in the community. Baseline: 3/5 MMT with Valsalva/no quick flicks tested  Goal status: INITIAL  5.  Patient will demonstrate independent and coordinated diaphragmatic breathing in supine with a 1:2 breathing pattern for improved down-regulation of the nervous system and improved management of intra-abdominal pressures in order to increase function at home and in the community. Baseline: increased chest excursion in supine with minimal abdominal rise Goal status: INITIAL    PLAN: PT Frequency: 1x/week  PT Duration: 10 weeks  Planned Interventions: Therapeutic exercises, Therapeutic activity, Neuromuscular re-education, Balance training, Gait training, Patient/Family education,  Self Care, Joint mobilization, Spinal mobilization, Cryotherapy, Moist heat, scar mobilization, Taping, and Manual therapy  Plan For Next Session:  manual/look at scar, rib flare technique/exercise, lifting mechanics  Cyndi Montejano, PT, DPT  09/11/2022, 7:55 AM

## 2022-09-17 ENCOUNTER — Ambulatory Visit: Payer: 59

## 2022-09-17 DIAGNOSIS — M6289 Other specified disorders of muscle: Secondary | ICD-10-CM

## 2022-09-17 DIAGNOSIS — M6281 Muscle weakness (generalized): Secondary | ICD-10-CM | POA: Diagnosis not present

## 2022-09-17 DIAGNOSIS — R278 Other lack of coordination: Secondary | ICD-10-CM

## 2022-09-17 DIAGNOSIS — M545 Low back pain, unspecified: Secondary | ICD-10-CM | POA: Diagnosis not present

## 2022-09-17 NOTE — Therapy (Signed)
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   Past Surgical History:  Procedure Laterality Date   ABDOMINAL HYSTERECTOMY     CHOLECYSTECTOMY     Patient Active Problem List   Diagnosis Date Noted   S/P cholecystectomy 12/25/2012   Hepatic hemangioma 12/25/2012   S/P hysterectomy 12/25/2012   Rubella during pregnancy 12/25/2012   H/O mumps 12/25/2012   History of chicken pox 12/25/2012    PCP: [place] Pink, MD  REFERRING PROVIDER: Benjaman Kindler, MD   REFERRING DIAG:  N32.81 (ICD-10-CM) - Overactive bladder  N39.41 (ICD-10-CM) - Urge incontinence   THERAPY DIAG:  Pelvic floor dysfunction  Other lack of coordination  Bilateral low back pain, unspecified chronicity, unspecified whether sciatica present  Muscle weakness (generalized)  Rationale for Evaluation and Treatment: Rehabilitation  ONSET DATE: 1-2 years   PRECAUTIONS: None  WEIGHT BEARING RESTRICTIONS: No  FALLS:  Has patient fallen in last 6 months? Yes. 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 Pt was referred due to having urinary leakage and urge incontinence problems. Pt also mentions inability to have penetrative sex due to pain. Pt currently having some cramping feeling in the lower abdomen but feels it is related to what she eats as Pt has had her gallbladder removed.    PATIENT GOALS: Pt would like to be able to have more control over her bowels/bladder, no pain during penetrative sex or annual GYN exams, also less pain in the low back    UROLOGICAL HISTORY Fluid intake: water, chamomile tea Pain with urination: No Fully empty bladder: Yes  Stream: Strong Urgency: Yes Frequency: ~14x  Nocturia: 2x Leakage: Urge to void, Walking to the bathroom, Sneezing, Laughing, and Intercourse Pads: No Bladder control (0-10): 7/10   GASTROINTESTINAL HISTORY Pain with bowel movement: No Type of bowel movement:Type (Bristol Stool Scale) 6 and 7 and Frequency 2x  on average Fully empty rectum: No    SEXUAL HISTORY/FUNCTION Pain with intercourse: During Penetration and Pain Interrupts Intercourse  Ability to have vaginal penetration:  No, due to pain; Deep thrusting: No Able to achieve orgasm?: No   OBSTETRICAL HISTORY Vaginal deliveries: G3P3 Tearing: tearing with 2nd child  C-section deliveries: 1   GYNECOLOGICAL HISTORY Hysterectomy: yes, abdominal, still has ovaries  Pelvic Organ Prolapse: None Pain with exam: yes and with vaginal u/s as well  Heaviness/pressure: no    SUBJECTIVE:  Patient  has been doing well overall but continues to have significant pain in the L knee. Pt has had this for awhile and now feels like "it is giving out." The cortisone shot has not helped either or OTC/meloxicam nor ice. DPT recommended knee brace and to contact doctor.    PAIN:  Are you having pain? Yes NPRS scale: L knee,     OBJECTIVE:    COGNITION: Overall cognitive status: Within functional limits for tasks assessed     POSTURE:  Grossly in seated B rounded  shoulders Iliac crest height: L iliac crest higher, L shoulder elevated Pelvic obliquity: WNL   SENSATION:  Light touch: intact, L2-S2 dermatomes     RANGE OF MOTION:    (Norm range in degrees)  LEFT 08/28/22 RIGHT 08/28/22  Lumbar forward flexion (65):  WNL    Lumbar extension (30): WNL    Lumbar lateral flexion (25):  Restricted  WNL  Thoracic and Lumbar rotation (30 degrees):    Restricted WNL   Hip Flexion (0-125):   WNL WNL  Hip IR (0-45):  WNL WNL  Hip ER (0-45):  WNL WNL  Hip Adduction:      Hip Abduction (0-40):  WNL WNL  Hip extension (0-15):     (*= pain, Blank rows = not tested)   STRENGTH: MMT    RLE 08/28/22 LLE 08/28/22  Hip Flexion 5 4  Hip Extension 5 5  Hip Abduction     Hip Adduction     Hip ER  5 5  Hip IR  5 5  Knee Extension 5 5  Knee Flexion 5 5  Dorsiflexion     Plantarflexion (seated) 5 5  (*= pain, Blank rows = not tested)   SPECIAL TESTS:   FABER (SN 81): negative B FADIR (SN 94): negative B    PALPATION:  Abdominal:  Scar mobility: c-section but will assess more next session Rib flare: present B  -muscular tension upon palpation with increased tenderness in center of abdomen and L quadrant by umbilicus  -increased fascial restriction noted possibly from abdominal hysterectomy   EXTERNAL PELVIC EXAM: Patient educated on the purpose of the pelvic exam and articulated understanding; patient consented to the exam verbally.  Breath coordination: present  Voluntary Contraction: present, 3/5 MMT with Valsalva Relaxation: full Perineal movement with sustained IAP increase ("bear down"): no change with Valsalva  Perineal movement with rapid IAP increase ("cough"): no change  (0= no contraction, 1= flicker, 2= weak squeeze, 3= fair squeeze with lift, 4= good squeeze and lift against resistance, 5= strong squeeze against strong resistance)   TODAY'S TREATMENT  Manual Therapy: Abdominal myofascial release for improved fascial sling  mobility and pain modulation   Scar mobilization in circular/vertical/horizontal motions for improved tissue extensibility  Scar restriction noted on both ends of c-section scar more L> R Improved with increased time and Pt felt no discomfort    Neuromuscular Re-education: Discussion on stability of the knee and providing images of the meniscus and how support is important. DPT recommended knee brace for stability and to contact PCP about continuing pain and instability.   Pre-treatment assessment:  POSTURE:  Iliac crest height: equal iliac crest height B compared to IE   Supine hooklying diaphragmatic breathing with VCs and TCs for downregulation of the nervous system and improved management of IAP  Sahrmann abdominal rehab   Supine hooklying TrA contraction with coordinated exhale   Attempted seated TrA activation for improved IAP management but Pt with increased bodily  compensations. Deferred until next session.   Patient response to interventions: Pt felt decreased tightness/taut at c-section scar    Patient Education:  Patient provided with HEP including: scar mobilization handout and supine TrA activation. Patient educated throughout session on appropriate technique and form using multi-modal cueing, HEP, and activity modification. Patient will benefit from further education in order to maximize compliance and understanding for long-term therapeutic gains.    ASSESSMENT:  Clinical Impression: Patient presents to clinic with excellent motivation to participate in today's session. Pt continues to demonstrate deficits in ROM, LE strength, IAP management, posture, pain, and scar mobility. Pt with increased L knee pain that at times causes instability at the knee. DPT recommended calling physician's office where she received cortisone injection and also recommended knee brace. Pt reports no incidents of urinary incontinence over the past 2 weeks which is a significant change from  IE. Upon palpation of c-section scar Pt with increased scar restriction on both ends of the scar with more on L >R. Pt with no increased pain or tenderness. Pt required moderate VCs and TCs during supine TrA activation for proper technique. Pt with improved scar restriction and reports feeling "less tight" after manual techniques. Pt responded positively to manual, active, and educational interventions. Patient will continue to benefit from skilled therapeutic intervention to address deficits in ROM, LE strength, IAP management, PFM coordination, PFM strength, PFM extensibility, posture, pain, and scar mobility in order to increase PLOF and improve overall QOL.    Objective Impairments: decreased coordination, decreased endurance, decreased strength, increased fascial restrictions, improper body mechanics, postural dysfunction, and pain.   Activity Limitations: lifting, bending, standing, continence, and locomotion level  Personal Factors: Age, Behavior pattern, Past/current experiences, Time since onset of injury/illness/exacerbation, and 1 comorbidity: hx of LBP and L knee pain  are also affecting patient's functional outcome.   Rehab Potential: Good  Clinical Decision Making: Evolving/moderate complexity  Evaluation Complexity: Moderate   GOALS: Goals reviewed with patient? Yes  SHORT TERM GOALS: Target date: 09/24/2022  Patient will demonstrate independence with HEP in order to maximize therapeutic gains and improve carryover from physical therapy sessions to ADLs in the home and community. Baseline: scar massage discussion Goal status: INITIAL   LONG TERM GOALS: Target date: 10/29/2022   Patient will score  >/= 69 on FOTO Urinary Problem and present with a 5 point change in FOTO Lumbar Spine  in order to demonstrate decreased pain, improved PFM coordination, improved IAP management and overall QOL.  Baseline: 62, 72 (lumbar) Goal status: INITIAL  2.  Patient will report less than  5 incidents of stress urinary incontinence over the course of 3 weeks while coughing/sneezing/laughing/intercourse in order to demonstrate improved PFM coordination, strength, and function for improved overall QOL. Baseline: urinary leakage with all the above Goal status: INITIAL  3.  Patient will report being able to return to activities including, but not limited to: penetrative sex, annual GYN exams, and diagnostic imaging (transvaginal u/s) without pain or limitation to indicate complete resolution of the chief concern and return to prior level of participation at home and in the community. Baseline: inability to participate in penetrative sex, 10/10 pain with annual GYN exams/diagnostic imaging Goal status: INITIAL  4.  Patient will demonstrate circumferential and sequential contraction of >3/5 MMT, > 5 sec hold x5 and 5 consecutive quick flicks with </= 10 min rest between testing bouts, and relaxation of the PFM coordinated with breath for improved management of intra-abdominal pressure and normal bowel and  bladder function without the presence of pain nor incontinence in order to improve participation at home and in the community. Baseline: 3/5 MMT with Valsalva/no quick flicks tested  Goal status: INITIAL  5.  Patient will demonstrate independent and coordinated diaphragmatic breathing in supine with a 1:2 breathing pattern for improved down-regulation of the nervous system and improved management of intra-abdominal pressures in order to increase function at home and in the community. Baseline: increased chest excursion in supine with minimal abdominal rise Goal status: INITIAL    PLAN: PT Frequency: 1x/week  PT Duration: 10 weeks  Planned Interventions: Therapeutic exercises, Therapeutic activity, Neuromuscular re-education, Balance training, Gait training, Patient/Family education, Self Care, Joint mobilization, Spinal mobilization, Cryotherapy, Moist heat, scar mobilization,  Taping, and Manual therapy  Plan For Next Session:  how did TrA go? Lifting mechanics/body mechanics with breath, and continue deep core  Edu On, PT, DPT  09/17/2022, 7:58 AM

## 2022-10-23 ENCOUNTER — Ambulatory Visit: Payer: 59

## 2022-10-28 ENCOUNTER — Ambulatory Visit: Payer: 59

## 2022-11-04 ENCOUNTER — Telehealth: Payer: Self-pay

## 2022-11-04 ENCOUNTER — Ambulatory Visit: Payer: 59 | Attending: Obstetrics and Gynecology

## 2022-11-04 NOTE — Telephone Encounter (Signed)
LVM in regards to no-show to appointment today. Asked Pt to return call if they would like to continue scheduling more appts as Pt has no more scheduled.

## 2022-11-25 DIAGNOSIS — R200 Anesthesia of skin: Secondary | ICD-10-CM | POA: Diagnosis not present

## 2023-02-02 DIAGNOSIS — M1712 Unilateral primary osteoarthritis, left knee: Principal | ICD-10-CM | POA: Insufficient documentation

## 2023-03-13 IMAGING — MG MM DIGITAL SCREENING BILAT W/ TOMO AND CAD
8 series · 8 of 24 positions shown · non-contrast
Comparison: Previous exams.

CLINICAL DATA: Screening.

EXAM:
DIGITAL SCREENING BILATERAL MAMMOGRAM WITH TOMOSYNTHESIS AND CAD
TECHNIQUE: Bilateral screening digital craniocaudal and mediolateral oblique
mammograms were obtained. Bilateral screening digital breast
tomosynthesis was performed. The images were evaluated with
computer-aided detection.

[L MLO synth-2D]
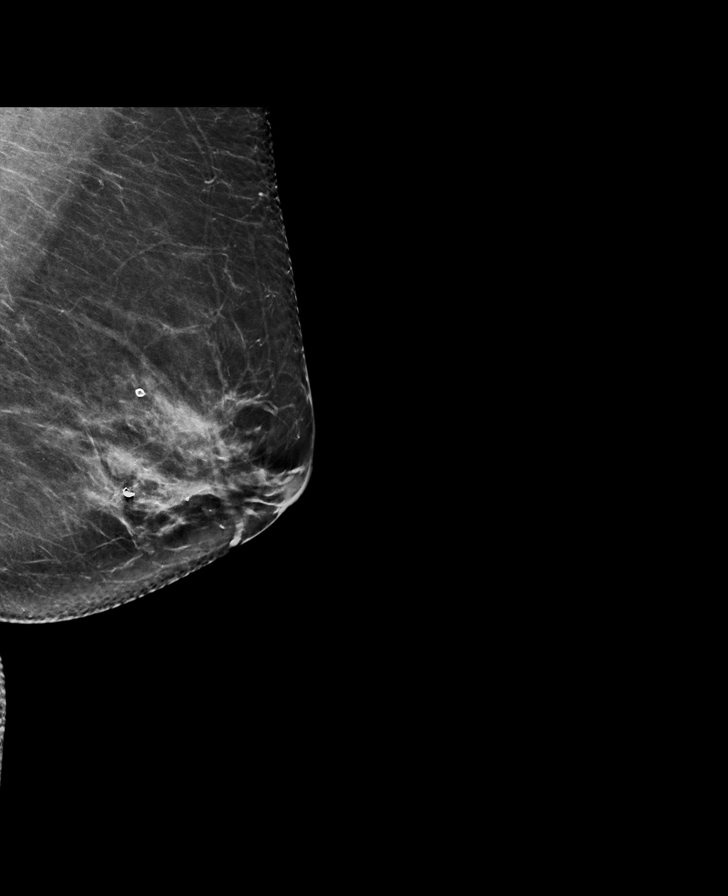

[L CC synth-2D]
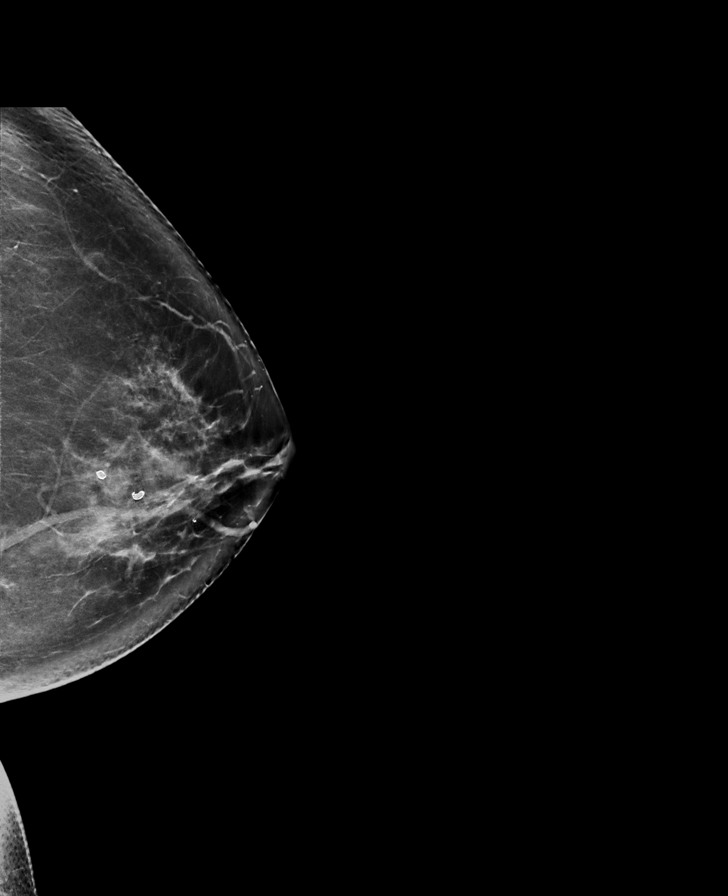

[R CC synth-2D]
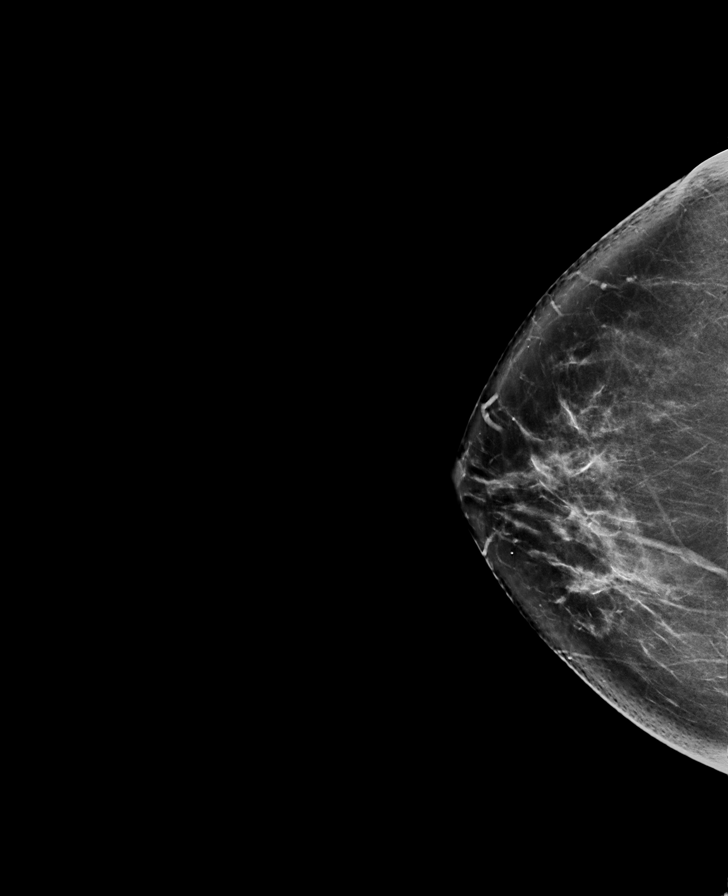

[R MLO synth-2D]
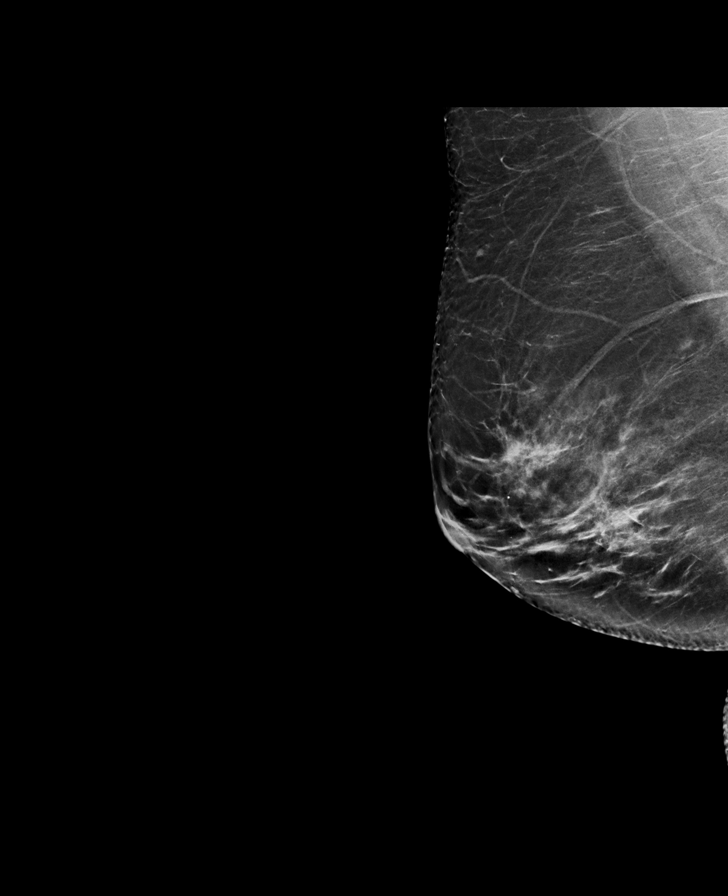

[R MLO tomo · tomo slice 41/81.0]
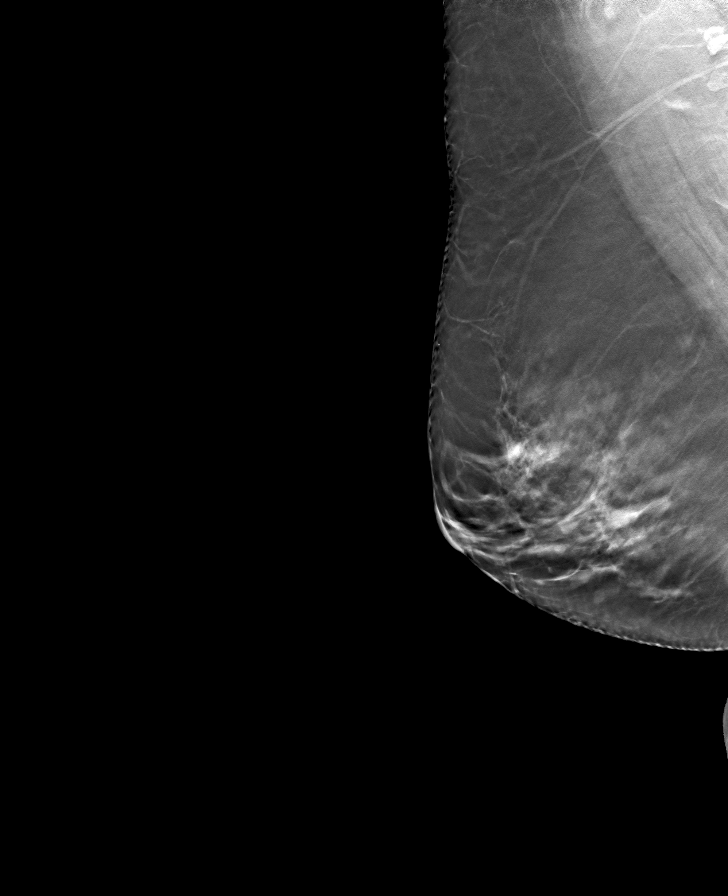

[L CC tomo · tomo slice 41/80.0]
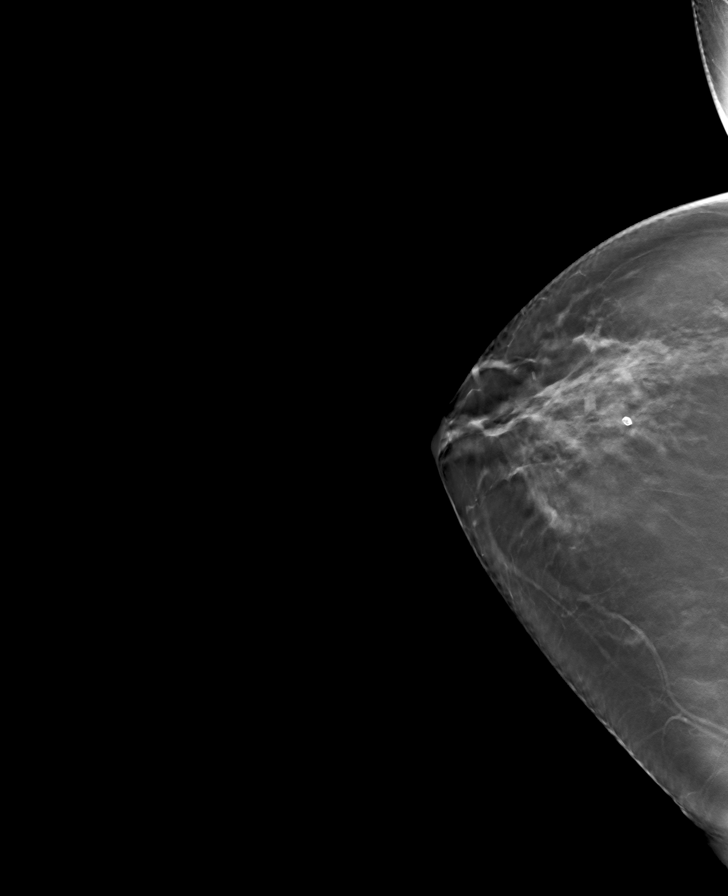

[R CC tomo · tomo slice 40/79.0]
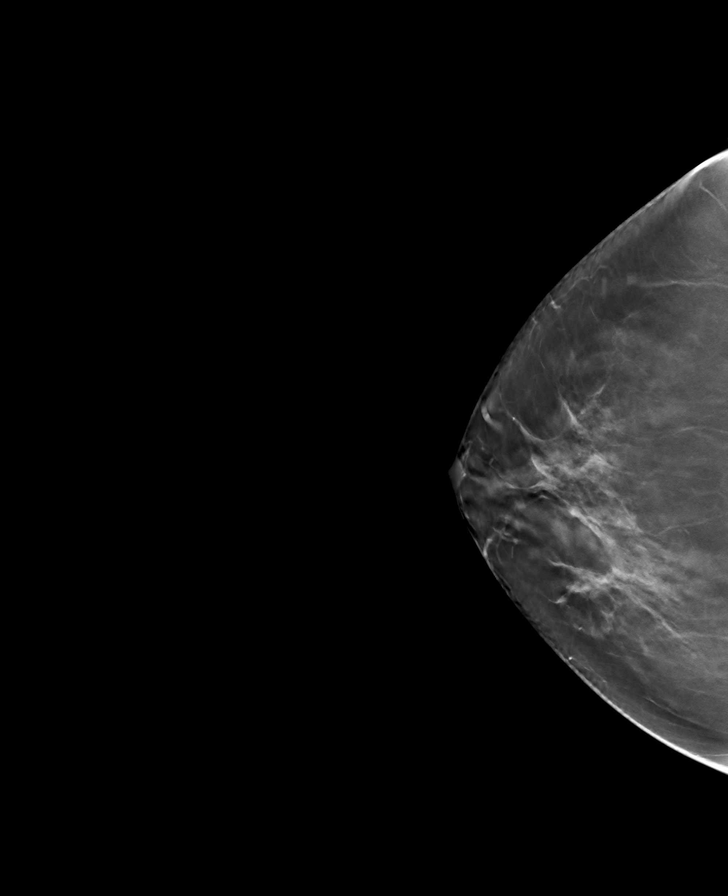

[L MLO tomo · tomo slice 40/79.0]
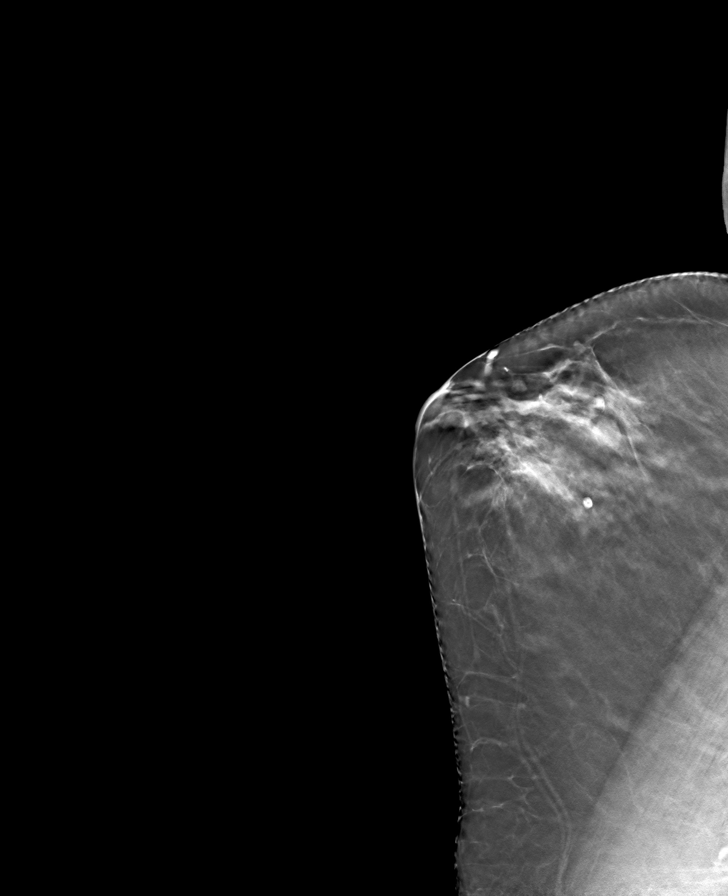

[8 of 24 positions shown; findings below may reference images not displayed]

ACR Breast Density Category b: There are scattered areas of
fibroglandular density.
FINDINGS: In the left breast, a possible mass warrants further evaluation. In
the right breast, no findings suspicious for malignancy.
IMPRESSION: Further evaluation is suggested for a possible mass in the left
breast.

RECOMMENDATION:
Diagnostic mammogram and possibly ultrasound of the left breast.
(Code:[id number]-S-ZZ7)

The patient will be contacted regarding the findings, and additional
imaging will be scheduled.

BI-RADS CATEGORY  0: Incomplete. Need additional imaging evaluation
and/or prior mammograms for comparison.

## 2023-06-04 ENCOUNTER — Other Ambulatory Visit: Payer: Self-pay | Admitting: Family Medicine

## 2023-06-04 DIAGNOSIS — Z1231 Encounter for screening mammogram for malignant neoplasm of breast: Secondary | ICD-10-CM

## 2023-07-08 ENCOUNTER — Ambulatory Visit
Admission: RE | Admit: 2023-07-08 | Discharge: 2023-07-08 | Disposition: A | Discharge status: Home / Self Care | Payer: 59 | Source: Ambulatory Visit | Attending: Family Medicine | Admitting: Family Medicine

## 2023-07-08 DIAGNOSIS — Z1231 Encounter for screening mammogram for malignant neoplasm of breast: Secondary | ICD-10-CM | POA: Insufficient documentation

## 2023-09-30 IMAGING — US US BREAST*L* LIMITED INC AXILLA
1 series · 8 of 8 positions shown · non-contrast
Comparison: Previous exam(s).

CLINICAL DATA: Short-term follow-up for a likely benign left breast
mass.

EXAM:
ULTRASOUND OF THE LEFT BREAST

[Series 1: us breast*left* limited inc axilla · 0.06mm/px · 8 of 8 slices shown]
[im 1/8]
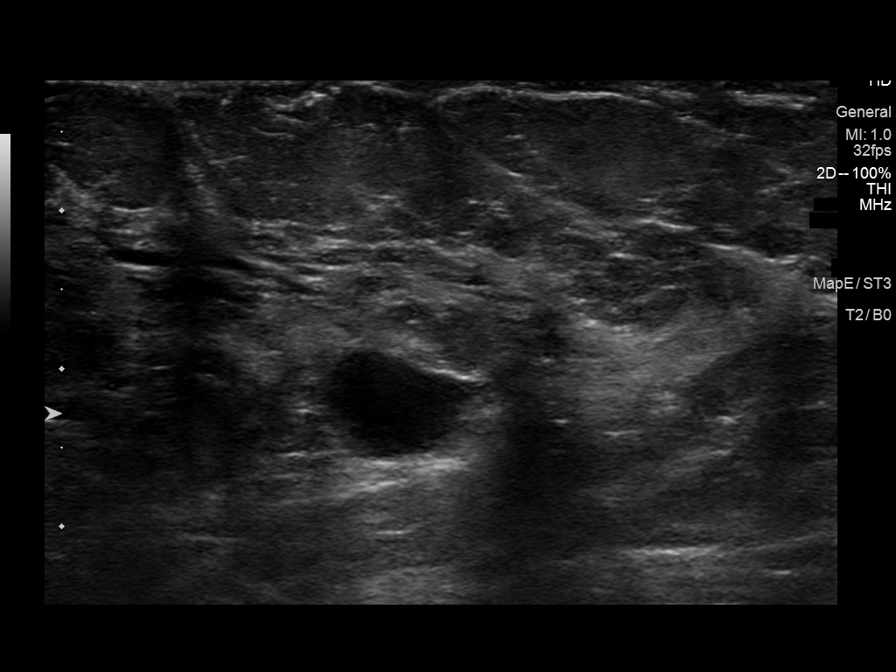
[im 2/8]
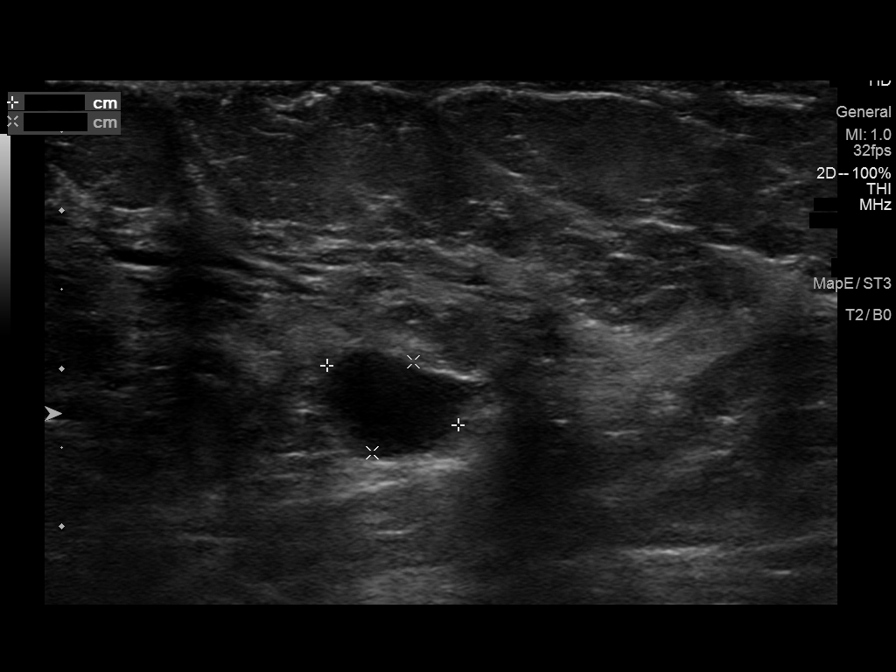
[im 3/8]
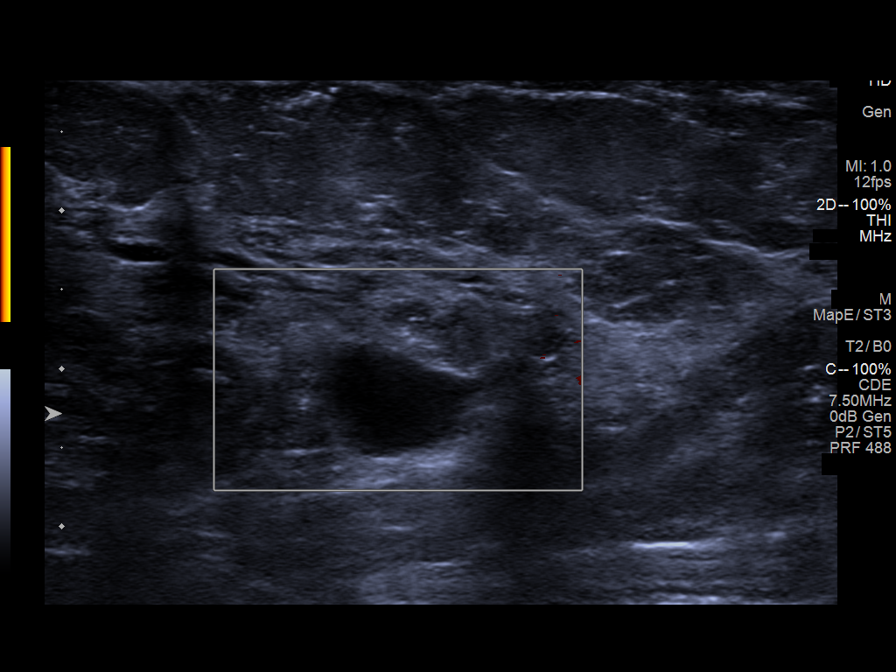
[im 4/8]
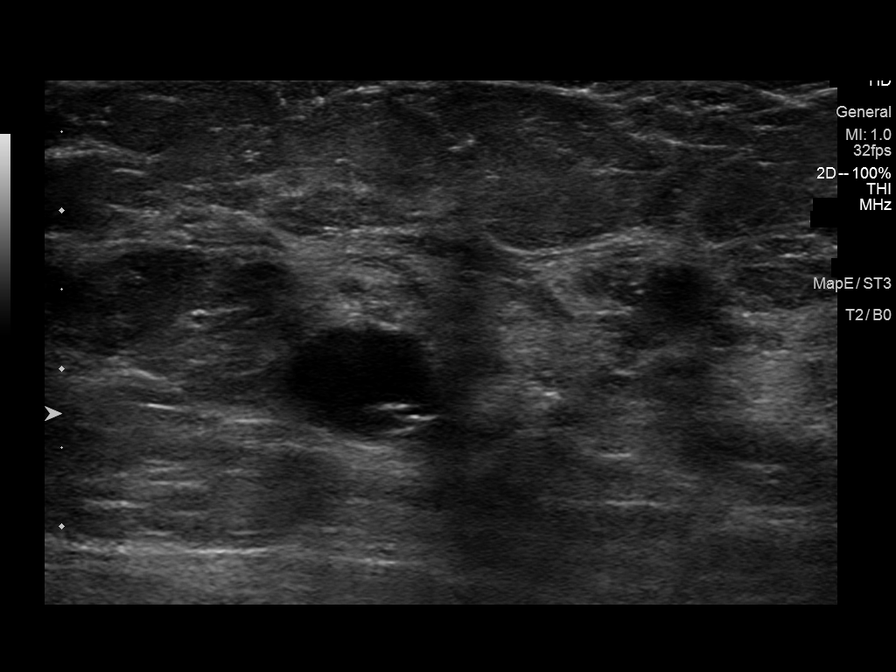
[im 5/8]
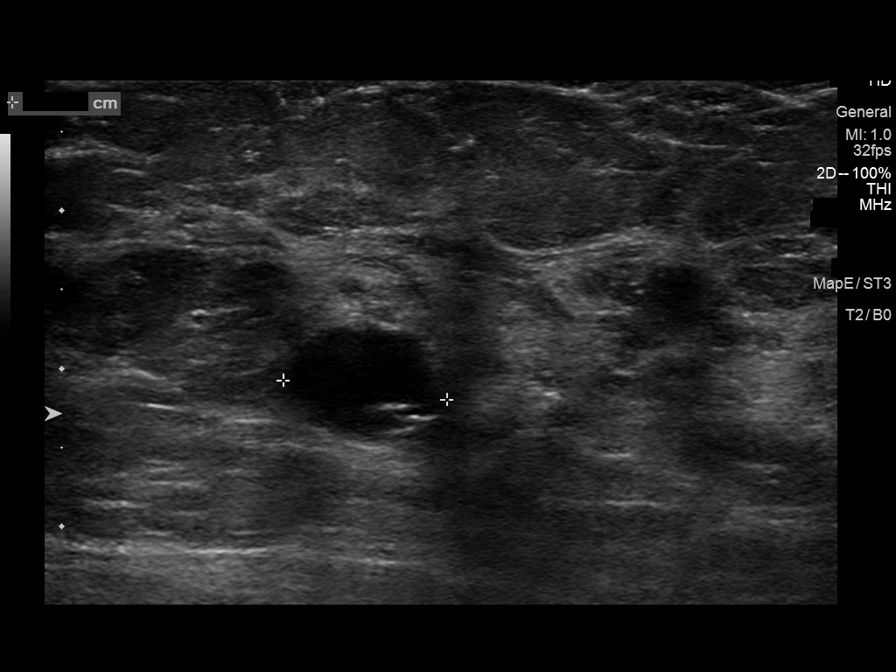
[im 6/8]
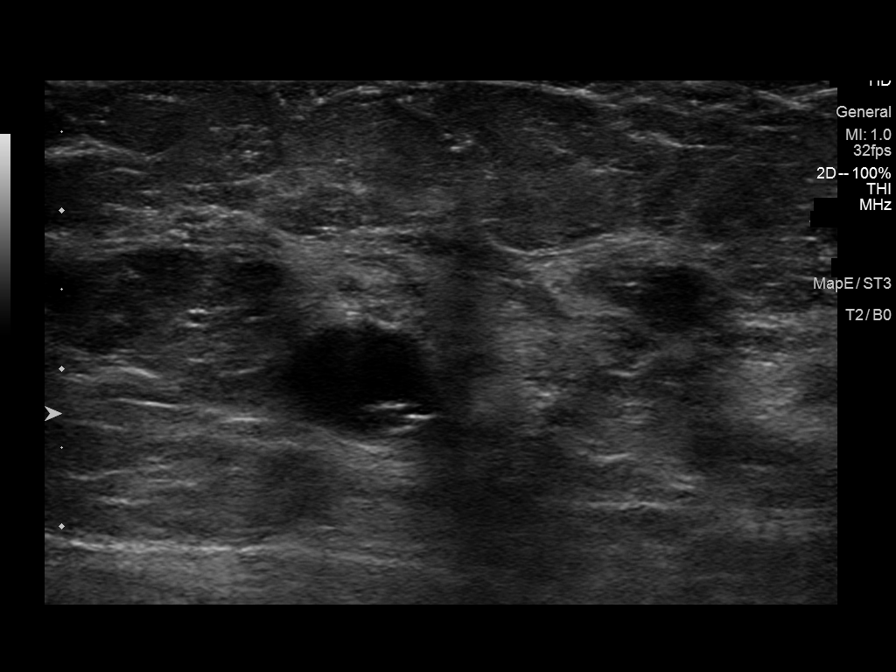
[im 7/8]
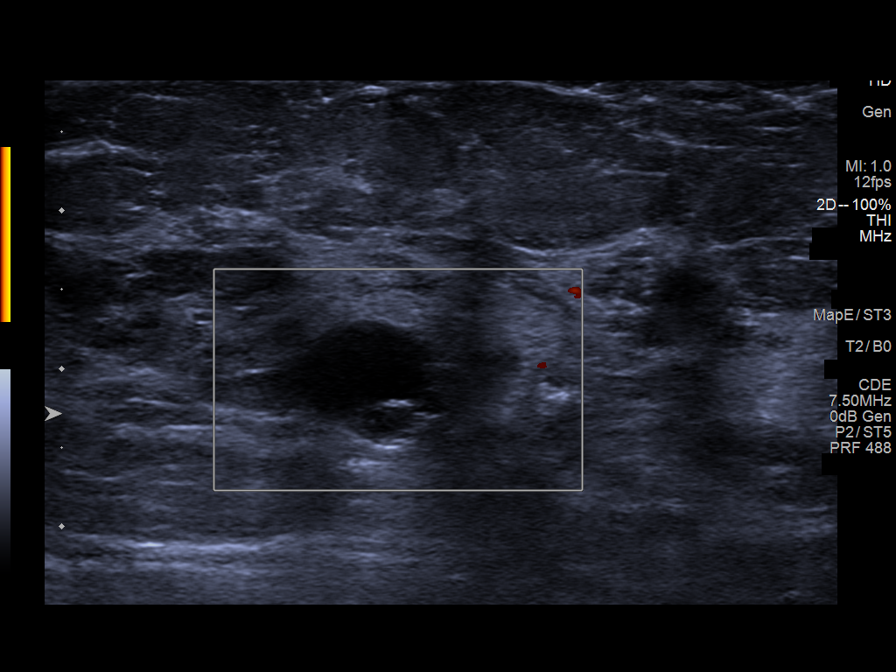
[im 8/8]
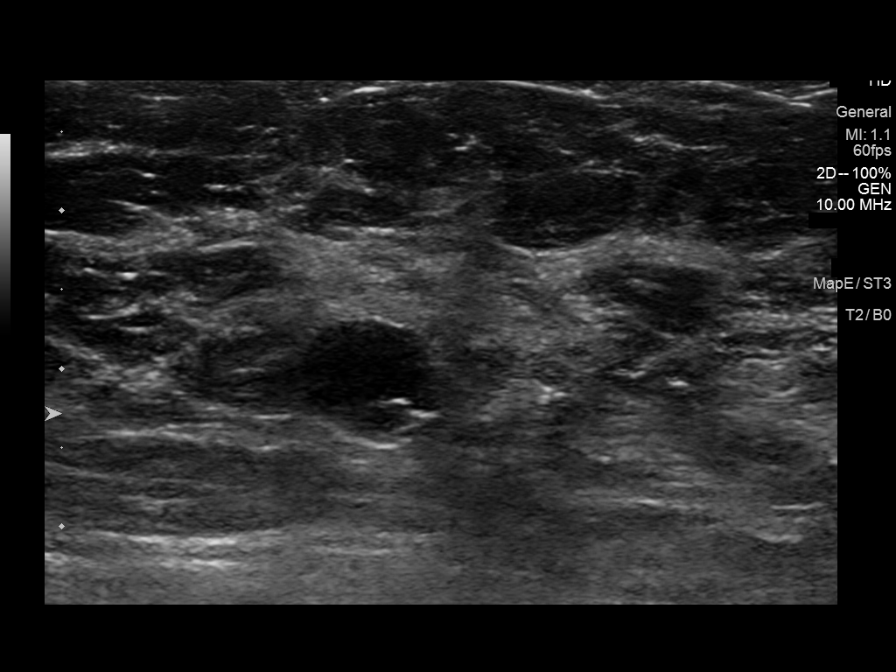

[8 of 8 positions shown; findings below may reference images not displayed]

FINDINGS: Ultrasound of the left breast at 6 o'clock, 2 cm from the nipple
demonstrates a stable oval hypoechoic mass measuring 1.0 x 0.6 x
cm, previously 1.1 x 0.5 x 1.0 cm.
IMPRESSION: The likely benign left breast mass at 6 o'clock is stable.

RECOMMENDATION:
Six-month follow-up bilateral diagnostic mammogram and left breast
ultrasound.

I have discussed the findings and recommendations with the patient.
If applicable, a reminder letter will be sent to the patient
regarding the next appointment.

BI-RADS CATEGORY  3: Probably benign.

## 2023-10-06 ENCOUNTER — Other Ambulatory Visit: Payer: Self-pay

## 2023-10-13 ENCOUNTER — Ambulatory Visit: Payer: 59 | Admitting: Anesthesiology

## 2023-10-13 ENCOUNTER — Encounter: Payer: Self-pay | Admitting: *Deleted

## 2023-10-13 ENCOUNTER — Ambulatory Visit
Admission: RE | Admit: 2023-10-13 | Discharge: 2023-10-13 | Disposition: A | Discharge status: Home / Self Care | Payer: 59 | Attending: Gastroenterology | Admitting: Gastroenterology

## 2023-10-13 ENCOUNTER — Encounter
Admission: RE | Disposition: A | Discharge status: Home / Self Care | Payer: Self-pay | Source: Home / Self Care | Attending: Gastroenterology

## 2023-10-13 DIAGNOSIS — Z9049 Acquired absence of other specified parts of digestive tract: Secondary | ICD-10-CM | POA: Diagnosis not present

## 2023-10-13 DIAGNOSIS — Z1211 Encounter for screening for malignant neoplasm of colon: Secondary | ICD-10-CM | POA: Diagnosis present

## 2023-10-13 DIAGNOSIS — K5730 Diverticulosis of large intestine without perforation or abscess without bleeding: Secondary | ICD-10-CM | POA: Diagnosis not present

## 2023-10-13 DIAGNOSIS — K219 Gastro-esophageal reflux disease without esophagitis: Secondary | ICD-10-CM | POA: Insufficient documentation

## 2023-10-13 DIAGNOSIS — K640 First degree hemorrhoids: Secondary | ICD-10-CM | POA: Insufficient documentation

## 2023-10-13 HISTORY — PX: COLONOSCOPY WITH PROPOFOL: SHX5780

## 2023-10-13 SURGERY — COLONOSCOPY WITH PROPOFOL
Anesthesia: General

## 2023-10-13 MED ORDER — PROPOFOL 1000 MG/100ML IV EMUL
INTRAVENOUS | Status: AC
  Filled 2023-10-13: qty 100

## 2023-10-13 MED ORDER — SODIUM CHLORIDE 0.9 % IV SOLN: INTRAVENOUS | Status: DC

## 2023-10-13 MED ORDER — PROPOFOL 500 MG/50ML IV EMUL
INTRAVENOUS | Status: DC | PRN
  Administered 2023-10-13: 150 ug/kg/min via INTRAVENOUS

## 2023-10-13 MED ORDER — PROPOFOL 10 MG/ML IV BOLUS
INTRAVENOUS | Status: DC | PRN
  Administered 2023-10-13: 100 mg via INTRAVENOUS

## 2023-10-13 NOTE — Anesthesia Postprocedure Evaluation (Signed)
Anesthesia Post Note  Patient: Cassandra Wang  Procedure(s) Performed: COLONOSCOPY WITH PROPOFOL  Patient location during evaluation: Endoscopy Anesthesia Type: General Level of consciousness: awake and alert Pain management: pain level controlled Vital Signs Assessment: post-procedure vital signs reviewed and stable Respiratory status: spontaneous breathing, nonlabored ventilation, respiratory function stable and patient connected to nasal cannula oxygen Cardiovascular status: blood pressure returned to baseline and stable Postop Assessment: no apparent nausea or vomiting Anesthetic complications: no   There were no known notable events for this encounter.   Last Vitals:  Vitals:   10/13/23 0813 10/13/23 0823  BP: 111/68 115/64  Pulse: 69 62  Resp: 13 12  Temp:    SpO2: 100% 100%    Last Pain:  Vitals:   10/13/23 0823  TempSrc:   PainSc: 0-No pain                 Louie Boston

## 2023-10-13 NOTE — Op Note (Signed)
[facility] Patient Name: Cassandra Wang Procedure Date: 10/13/2023 7:18 AM MRN: [id number] Account #: [id number] Date of Birth: [birth year] Admit Type: Outpatient Age: 57 Room: [facility] ENDO ROOM 3 Gender: Female Note Status: Finalized Instrument Name: Prentice Docker [id number] Procedure:             Colonoscopy Indications:           Screening for colorectal malignant neoplasm Providers:             Eather Colas MD, MD Referring MD:          Eather Colas MD, MD (Referring MD), Rhona Leavens.                         Burnett Sheng, MD (Referring MD) Medicines:             Monitored Anesthesia Care Complications:         No immediate complications. Procedure:             Pre-Anesthesia Assessment:                        - Prior to the procedure, a History and Physical was                         performed, and patient medications and allergies were                         reviewed. The patient is competent. The risks and                         benefits of the procedure and the sedation options and                         risks were discussed with the patient. All questions                         were answered and informed consent was obtained.                         Patient identification and proposed procedure were                         verified by the physician, the nurse, the                         anesthesiologist, the anesthetist and the technician                         in the endoscopy suite. Mental Status Examination:                         alert and oriented. Airway Examination: normal                         oropharyngeal airway and neck mobility. Respiratory                         Examination: clear to auscultation. CV Examination:  normal. Prophylactic Antibiotics: The patient does not                         require prophylactic antibiotics. Prior                         Anticoagulants: The patient has taken no  anticoagulant                         or antiplatelet agents. ASA Grade Assessment: II - A                         patient with mild systemic disease. After reviewing                         the risks and benefits, the patient was deemed in                         satisfactory condition to undergo the procedure. The                         anesthesia plan was to use monitored anesthesia care                         (MAC). Immediately prior to administration of                         medications, the patient was re-assessed for adequacy                         to receive sedatives. The heart rate, respiratory                         rate, oxygen saturations, blood pressure, adequacy of                         pulmonary ventilation, and response to care were                         monitored throughout the procedure. The physical                         status of the patient was re-assessed after the                         procedure.                        After obtaining informed consent, the colonoscope was                         passed under direct vision. Throughout the procedure,                         the patient's blood pressure, pulse, and oxygen                         saturations were monitored continuously. The  Colonoscope was introduced through the anus and                         advanced to the the terminal ileum. The colonoscopy                         was performed without difficulty. The patient                         tolerated the procedure well. The quality of the bowel                         preparation was excellent. The terminal ileum,                         ileocecal valve, appendiceal orifice, and rectum were                         photographed. Findings:      The perianal and digital rectal examinations were normal.      The terminal ileum appeared normal.      A few large-mouthed diverticula were found in the cecum.      Internal  hemorrhoids were found during retroflexion. The hemorrhoids       were Grade I (internal hemorrhoids that do not prolapse).      The exam was otherwise without abnormality on direct and retroflexion       views. Impression:            - The examined portion of the ileum was normal.                        - Diverticulosis in the cecum.                        - Internal hemorrhoids.                        - The examination was otherwise normal on direct and                         retroflexion views.                        - No specimens collected. Recommendation:        - Discharge patient to home.                        - Resume previous diet.                        - Continue present medications.                        - Repeat colonoscopy in 10 years for screening                         purposes.                        - Return to referring physician as previously  scheduled. Procedure Code(s):     --- Professional ---                        I6962, Colorectal cancer screening; colonoscopy on                         individual not meeting criteria for high risk Diagnosis Code(s):     --- Professional ---                        Z12.11, Encounter for screening for malignant neoplasm                         of colon                        K64.0, First degree hemorrhoids                        K57.30, Diverticulosis of large intestine without                         perforation or abscess without bleeding CPT copyright 2022 American Medical Association. All rights reserved. The codes documented in this report are preliminary and upon coder review may  be revised to meet current compliance requirements. Eather Colas MD, MD 10/13/2023 8:03:24 AM Number of Addenda: 0 Note Initiated On: 10/13/2023 7:18 AM Scope Withdrawal Time: 0 hours 7 minutes 32 seconds  Total Procedure Duration: 0 hours 11 minutes 16 seconds  Estimated Blood Loss:  Estimated blood loss:  none.      [facility]

## 2023-10-13 NOTE — H&P (Signed)
Outpatient short stay form Pre-procedure 10/13/2023  Regis Bill, MD  Primary Physician: Jerl Mina, MD  Reason for visit:  Screening  History of present illness:    57 y/o lady with history of GERD here for screening colonoscopy. Last colonoscopy in 2014 was normal. No blood thinners. No family history of GI malignancies. History of cholecystectomy.    Current Facility-Administered Medications:    0.9 %  sodium chloride infusion, , Intravenous, Continuous, Margaretann Abate, Rossie Muskrat, MD, Last Rate: 20 mL/hr at 10/13/23 0724, New Bag at 10/13/23 0724  Medications Prior to Admission  Medication Sig Dispense Refill Last Dose   Boswellia-Glucosamine-Vit D (OSTEO BI-FLEX ONE PER DAY PO) Take by mouth.   Past Week   meloxicam (MOBIC) 7.5 MG tablet Take 7.5 mg by mouth daily.   Past Week   fluconazole (DIFLUCAN) 200 MG tablet Take as directed (Patient not taking: Reported on 08/20/2022) 8 tablet 0    mometasone (ELOCON) 0.1 % cream Apply 1 application topically daily as needed (Rash). 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    Review of systems:  Otherwise negative.    Physical Exam  Gen: Alert, oriented. Appears stated age.  HEENT: PERRLA. Lungs: No respiratory distress CV: RRR Abd: soft, benign, no masses Ext: No edema    Planned procedures: Proceed with colonoscopy. The patient understands the nature of the planned procedure, indications, risks,  alternatives and potential complications including but not limited to bleeding, infection, perforation, damage to internal organs and possible oversedation/side effects from anesthesia. The patient agrees and gives consent to proceed.  Please refer to procedure notes for findings, recommendations and patient disposition/instructions.     Regis Bill, MD [facility]

## 2023-10-13 NOTE — Transfer of Care (Signed)
Immediate Anesthesia Transfer of Care Note  Patient: Dashawna P Clements  Procedure(s) Performed: COLONOSCOPY WITH PROPOFOL  Patient Location: PACU  Anesthesia Type:General  Level of Consciousness: awake, alert , oriented, and sedated  Airway & Oxygen Therapy: Patient Spontanous Breathing and Patient connected to nasal cannula oxygen  Post-op Assessment: Report given to RN and Post -op Vital signs reviewed and stable  Post vital signs: Reviewed and stable  Last Vitals:  Vitals Value Taken Time  BP    Temp    Pulse 71 10/13/23 0803  Resp 20 10/13/23 0803  SpO2 96 % 10/13/23 0803  Vitals shown include unfiled device data.  Last Pain:  Vitals:   10/13/23 0703  TempSrc: Temporal         Complications: There were no known notable events for this encounter.

## 2023-10-13 NOTE — Interval H&P Note (Signed)
History and Physical Interval Note:  10/13/2023 7:38 AM  Cassandra Wang  has presented today for surgery, with the diagnosis of Z12.11 (ICD-10-CM) - Colon cancer screening.  The various methods of treatment have been discussed with the patient and family. After consideration of risks, benefits and other options for treatment, the patient has consented to  Procedure(s): COLONOSCOPY WITH PROPOFOL (N/A) as a surgical intervention.  The patient's history has been reviewed, patient examined, no change in status, stable for surgery.  I have reviewed the patient's chart and labs.  Questions were answered to the patient's satisfaction.     Regis Bill  Ok to proceed with colonoscopy

## 2023-10-13 NOTE — Anesthesia Preprocedure Evaluation (Addendum)
Anesthesia Evaluation  Patient identified by MRN, date of birth, ID band Patient awake    Reviewed: Allergy & Precautions, NPO status , Patient's Chart, lab work & pertinent test results  History of Anesthesia Complications Negative for: history of anesthetic complications  Airway Mallampati: I  TM Distance: >3 FB Neck ROM: full    Dental no notable dental hx.    Pulmonary neg pulmonary ROS   Pulmonary exam normal        Cardiovascular negative cardio ROS Normal cardiovascular exam     Neuro/Psych negative neurological ROS  negative psych ROS   GI/Hepatic Neg liver ROS,GERD  ,,  Endo/Other  negative endocrine ROS    Renal/GU negative Renal ROS  negative genitourinary   Musculoskeletal   Abdominal   Peds  Hematology negative hematology ROS (+)   Anesthesia Other Findings Past Medical History: No date: Abnormal Pap smear of cervix No date: Dense breast tissue No date: GERD (gastroesophageal reflux disease) No date: VAIN (vaginal intraepithelial neoplasia)     Comment:  h/o VAIN II in 2008, treated with Effudex, 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  Past Surgical History: No date: ABDOMINAL HYSTERECTOMY No date: CHOLECYSTECTOMY  BMI    Body Mass Index: 27.64 kg/m      Reproductive/Obstetrics negative OB ROS                             Anesthesia Physical Anesthesia Plan  ASA: 2  Anesthesia Plan: General   Post-op Pain Management:    Induction: Intravenous  PONV Risk Score and Plan: 2 and Propofol infusion and TIVA  Airway Management Planned: Natural Airway and Nasal Cannula  Additional Equipment:   Intra-op Plan:   Post-operative Plan:   Informed Consent: I have reviewed the patients History and Physical, chart, labs and discussed the procedure including the risks, benefits and alternatives for the proposed anesthesia with the patient  or authorized representative who has indicated his/her understanding and acceptance.     Dental Advisory Given  Plan Discussed with: Anesthesiologist, CRNA and Surgeon  Anesthesia Plan Comments: (Patient consented for risks of anesthesia including but not limited to:  - adverse reactions to medications - risk of airway placement if required - damage to eyes, teeth, lips or other oral mucosa - nerve damage due to positioning  - sore throat or hoarseness - Damage to heart, brain, nerves, lungs, other parts of body or loss of life  Patient voiced understanding and assent.)       Anesthesia Quick Evaluation

## 2023-10-14 ENCOUNTER — Encounter: Payer: Self-pay | Admitting: Gastroenterology

## 2024-02-08 NOTE — Discharge Instructions (Signed)
 Instructions after Total Knee Replacement   Cassandra Wang P. Angie Fava., M.D.    Dept. of Orthopaedics & Sports Medicine [facility] [address] [place], [place]  [address]  Phone: [phone]   Fax: [phone]       [email or web address]       DIET: Drink plenty of non-alcoholic fluids. Resume your normal diet. Include foods high in fiber.  ACTIVITY:  You may use crutches or a walker with weight-bearing as tolerated, unless instructed otherwise. You may be weaned off of the walker or crutches by your Physical Therapist.  Do NOT place pillows under the knee. Anything placed under the knee could limit your ability to straighten the knee.   Use the Bone Foam 3 times a day for 30 minutes each session to help straighten the knee. Continue doing gentle exercises. Exercising will reduce the pain and swelling, increase motion, and prevent muscle weakness.   Please continue to use the TED compression stockings for 6 weeks. You may remove the stockings at night, but should reapply them in the morning. Do not drive or operate any equipment until instructed.  WOUND CARE:  The initial dressing (Aquacel) can remain in place for 7 days (see separate instructions). Continue to use the PolarCare or ice packs periodically to reduce pain and swelling. You may bathe or shower after the staples are removed at the first office visit following surgery.  MEDICATIONS: You may resume your regular medications. Please take the pain medication as prescribed on the medication. Do not take pain medication on an empty stomach. Unless instructed otherwise, you should take an enteric-coated aspirin 81 mg. TWICE a day. (This along with elevation will help reduce the possibility of blood clots/phlebitis in your operated leg.) Use a stool softener (such as Senokot-S or Colace) daily and a laxative (such as Miralax or Dulcolax) as needed to prevent constipation.  Do not drive or drink alcoholic beverages when  taking pain medications.  CALL THE OFFICE FOR: Temperature above 101 degrees Excessive bleeding or drainage on the dressing. Excessive swelling, coldness, or paleness of the toes. Persistent nausea and vomiting.  FOLLOW-UP:  You should have an appointment to return to the office in 10-14 days after surgery. Arrangements have been made for continuation of Physical Therapy (either home therapy or outpatient therapy).     [facility] Department Directory         [email or web address]       [email or web address]          Cardiology  Appointments: [place] - [phone]  Endocrinology  Appointments: [place] [phone] [place] - [phone]  Gastroenterology  Appointments: [employer] [phone] [place] - [phone]        General Surgery   Appointments: [facility]  Internal Medicine/Family Medicine  Appointments: [facility] [place] - [phone] [place] - [phone]  Metabolic and Weigh Loss Surgery  Appointments: [facility]        Neurology  Appointments: [place] [phone] [place] - [phone]  Neurosurgery  Appointments: [place]  Obstetrics & Gynecology  Appointments: [place] [phone] [place] - [phone]        Pediatrics  Appointments: [place] [phone] [place] - [phone]  Physiatry  Appointments: [place] [phone]  Physical Therapy  Appointments: [place] - [phone]        Podiatry  Appointments: [place] [phone] [place] - [phone]  Pulmonology  Appointments: [facility] [facility]  Rheumatology  Appointments: [place] [phone]  [place] Location: [facility]  [address] [place], [place]  [address]  [place] Location: [facility]. [address] [place], [place]  [address]  [place]  Location: [facility] [address] [place], [place]  [address]

## 2024-02-09 ENCOUNTER — Other Ambulatory Visit: Payer: Self-pay

## 2024-02-09 ENCOUNTER — Encounter
Admission: RE | Admit: 2024-02-09 | Discharge: 2024-02-09 | Disposition: A | Discharge status: Home / Self Care | Payer: Medicaid Other | Source: Ambulatory Visit | Attending: Orthopedic Surgery | Admitting: Orthopedic Surgery

## 2024-02-09 DIAGNOSIS — R9431 Abnormal electrocardiogram [ECG] [EKG]: Secondary | ICD-10-CM | POA: Diagnosis not present

## 2024-02-09 DIAGNOSIS — Z880 Allergy status to penicillin: Secondary | ICD-10-CM | POA: Insufficient documentation

## 2024-02-09 DIAGNOSIS — M1712 Unilateral primary osteoarthritis, left knee: Secondary | ICD-10-CM | POA: Insufficient documentation

## 2024-02-09 DIAGNOSIS — Z91040 Latex allergy status: Secondary | ICD-10-CM | POA: Insufficient documentation

## 2024-02-09 DIAGNOSIS — Z01812 Encounter for preprocedural laboratory examination: Secondary | ICD-10-CM

## 2024-02-09 DIAGNOSIS — Z01818 Encounter for other preprocedural examination: Secondary | ICD-10-CM | POA: Insufficient documentation

## 2024-02-09 HISTORY — DX: Rheumatic disorders of both mitral and aortic valves: I08.0

## 2024-02-09 HISTORY — DX: Radiculopathy, cervical region: M54.12

## 2024-02-09 HISTORY — DX: Unspecified osteoarthritis, unspecified site: M19.90

## 2024-02-09 HISTORY — DX: Cardiac murmur, unspecified: R01.1

## 2024-02-09 HISTORY — DX: Other hammer toe(s) (acquired), right foot: M20.41

## 2024-02-09 HISTORY — DX: Other seasonal allergic rhinitis: J30.2

## 2024-02-09 HISTORY — DX: Hemangioma of intra-abdominal structures: D18.03

## 2024-02-09 LAB — COMPREHENSIVE METABOLIC PANEL
ALT: 23 U/L (ref 0–44)
AST: 23 U/L (ref 15–41)
Albumin: 4.1 g/dL (ref 3.5–5.0)
Alkaline Phosphatase: 74 U/L (ref 38–126)
Anion gap: 6 (ref 5–15)
BUN: 18 mg/dL (ref 6–20)
CO2: 27 mmol/L (ref 22–32)
Calcium: 9.4 mg/dL (ref 8.9–10.3)
Chloride: 105 mmol/L (ref 98–111)
Creatinine, Ser: 0.6 mg/dL (ref 0.44–1.00)
GFR, Estimated: >60 mL/min (ref >60)
Glucose, Bld: 87 mg/dL (ref 70–99)
Potassium: 3.6 mmol/L (ref 3.5–5.1)
Sodium: 138 mmol/L (ref 135–145)
Total Bilirubin: 0.6 mg/dL (ref 0.0–1.2)
Total Protein: 7.4 g/dL (ref 6.5–8.1)

## 2024-02-09 LAB — URINALYSIS, ROUTINE W REFLEX MICROSCOPIC
Bilirubin Urine: NEGATIVE
Glucose, UA: NEGATIVE mg/dL
Hgb urine dipstick: NEGATIVE
Ketones, ur: NEGATIVE mg/dL
Leukocytes,Ua: NEGATIVE
Nitrite: NEGATIVE
Protein, ur: NEGATIVE mg/dL
Specific Gravity, Urine: 1.004 — ABNORMAL LOW (ref 1.005–1.030)
pH: 5 (ref 5.0–8.0)

## 2024-02-09 LAB — SURGICAL PCR SCREEN
MRSA, PCR: NEGATIVE
Staphylococcus aureus: NEGATIVE

## 2024-02-09 LAB — CBC
HCT: 41.7 % (ref 36.0–46.0)
Hemoglobin: 13.7 g/dL (ref 12.0–15.0)
MCH: 29 pg (ref 26.0–34.0)
MCHC: 32.9 g/dL (ref 30.0–36.0)
MCV: 88.2 fL (ref 80.0–100.0)
Platelets: 250 10*3/uL (ref 150–400)
RBC: 4.73 MIL/uL (ref 3.87–5.11)
RDW: 13.2 % (ref 11.5–15.5)
WBC: 6.1 10*3/uL (ref 4.0–10.5)
nRBC: 0 % (ref 0.0–0.2)

## 2024-02-09 LAB — C-REACTIVE PROTEIN: CRP: <0.5 mg/dL (ref <1.0)

## 2024-02-09 LAB — SEDIMENTATION RATE: Sed Rate: 5 mm/h (ref 0–30)

## 2024-02-09 NOTE — Patient Instructions (Addendum)
 Your procedure is scheduled on: Monday, March 10 Report to the Registration Desk on the 1st floor of the [facility] To find out your arrival time, please call [phone] between 1PM - 3PM on: Friday, March 7 If your arrival time is 6:00 am, do not arrive before that time as the [facility] entrance doors do not open until 6:00 am.  REMEMBER: Instructions that are not followed completely may result in serious medical risk, up to and including death; or upon the discretion of your surgeon and anesthesiologist your surgery may need to be rescheduled.  Do not eat food after midnight the night before surgery.  No gum chewing or hard candies.  You may however, drink CLEAR liquids up to 2 hours before you are scheduled to arrive for your surgery. Do not drink anything within 2 hours of your scheduled arrival time.  Clear liquids include: - water  - apple juice without pulp - gatorade (not RED colors) - black coffee or tea (Do NOT add milk or creamers to the coffee or tea) Do NOT drink anything that is not on this list.  In addition, your doctor has ordered for you to drink the provided:  Ensure Pre-Surgery Clear Carbohydrate Drink  Drinking this carbohydrate drink up to two hours before surgery helps to reduce insulin resistance and improve patient outcomes. Please complete drinking 2 hours before scheduled arrival time.  One week prior to surgery: starting March 3 Stop meloxicam and Anti-inflammatories (NSAIDS) such as Advil, Aleve, Ibuprofen, Motrin, Naproxen, Naprosyn and Aspirin based products such as Excedrin, Goody's Powder, BC Powder. Stop ANY OVER THE COUNTER supplements until after surgery. Stop vitamin D, osteo bi-flex, multiple vitamins, fish oil.  You may however, continue to take Tylenol if needed for pain up until the day of surgery.  ON THE DAY OF SURGERY DO NOT TAKE ANY MEDICATIONS   No Alcohol for 24 hours before or after surgery.  No Smoking including e-cigarettes  for 24 hours before surgery.  No chewable tobacco products for at least 6 hours before surgery.  No nicotine patches on the day of surgery.  Do not use any "recreational" drugs for at least a week (preferably 2 weeks) before your surgery.  Please be advised that the combination of cocaine and anesthesia may have negative outcomes, up to and including death. If you test positive for cocaine, your surgery will be cancelled.  On the morning of surgery brush your teeth with toothpaste and water, you may rinse your mouth with mouthwash if you wish. Do not swallow any toothpaste or mouthwash.  Since you have an allergy to the CHG soap, shower using antibacterial soap daily for 5 days before surgery including the morning of surgery before coming to the hospital. Wear clean clothes.  Do not wear jewelry, make-up, hairpins, clips or nail polish.  For welded (permanent) jewelry: bracelets, anklets, waist bands, etc.  Please have this removed prior to surgery.  If it is not removed, there is a chance that hospital personnel will need to cut it off on the day of surgery.  Do not wear lotions, powders, or perfumes.   Do not shave body hair from the neck down 48 hours before surgery.  Contact lenses, hearing aids and dentures may not be worn into surgery.  Do not bring valuables to the hospital. [facility] is not responsible for any missing/lost belongings or valuables.   Notify your doctor if there is any change in your medical condition (cold, fever, infection).  Wear comfortable clothing (specific to your surgery type) to the hospital.  After surgery, you can help prevent lung complications by doing breathing exercises.  Take deep breaths and cough every 1-2 hours. Your doctor may order a device called an Incentive Spirometer to help you take deep breaths.  If you are being admitted to the hospital overnight, leave your suitcase in the car. After surgery it may be brought to your room.  In  case of increased patient census, it may be necessary for you, the patient, to continue your postoperative care in the Same Day Surgery department.  If you are being discharged the day of surgery, you will not be allowed to drive home. You will need a responsible individual to drive you home and stay with you for 24 hours after surgery.   If you are taking public transportation, you will need to have a responsible individual with you.  Please call the Pre-admissions Testing Dept. at [phone] if you have any questions about these instructions.  Surgery Visitation Policy:  Patients having surgery or a procedure may have two visitors.  Children under the age of 69 must have an adult with them who is not the patient.  Temporary Visitor Restrictions Due to increasing cases of flu, RSV and COVID-19: Children ages 2 and under will not be able to visit patients in [facility] hospitals under most circumstances.  Inpatient Visitation:    Visiting hours are 7 a.m. to 8 p.m. Up to four visitors are allowed at one time in a patient room. The visitors may rotate out with other people during the day.  One visitor age 21 or older may stay with the patient overnight and must be in the room by 8 p.m.          Preoperative Educational Videos for Total Hip, Knee and Shoulder Replacements  To better prepare for surgery, please view our videos that explain the physical activity and discharge planning required to have the best surgical recovery at [facility].  [email or web address]  Questions? Call [phone] or email [email or web address]

## 2024-02-13 LAB — LATEX, IGE: Latex: <0.1 kU/L

## 2024-02-13 LAB — IGE: IgE (Immunoglobulin E), Serum: 13 [IU]/mL (ref 6–495)

## 2024-02-15 ENCOUNTER — Encounter: Payer: Self-pay | Admitting: Orthopedic Surgery

## 2024-02-15 DIAGNOSIS — N83292 Other ovarian cyst, left side: Secondary | ICD-10-CM | POA: Insufficient documentation

## 2024-02-15 DIAGNOSIS — R102 Pelvic and perineal pain: Secondary | ICD-10-CM | POA: Insufficient documentation

## 2024-02-15 NOTE — H&P (Signed)
 ORTHOPAEDIC HISTORY & PHYSICAL Cassandra Wang, [place] - 02/11/2024 3:30 PM EST Formatting of this note is different from the original. NAME: Cassandra Wang H&P Date: 02/11/2024 Procedure Date: 02/16/2024  Chief Complaint: left knee pain  HPI Cassandra Wang is a 58 y.o. female who has severe Left knee pain. She states that she has a long history with multiple years of left knee pain that has increased over the last year in severity. She states that most of the pain localizes along the medial and anterior aspects of the knee. She does report intermittent swelling with occasional locking and giving way symptoms. She states that the pain is made worse with any prolonged weightbearing, as well as rising and using stairs. It has begun to affect her ability to ambulate long distances and perform her ADLs. She has failed conservative treatment including Tylenol, NSAIDs, intra-articular corticosteroid injections, viscosupplementation injections, activity modification and physical therapy. She does not use any ambulatory aids at this juncture . She has requested operative intervention for relief of her DJD symptoms. Patient states that she was once told that she had a heart murmur, but was told that it was innocent and had a previous workup that was negative for any underlying pathology. She denies having any pulmonary issues. No history of any DVTs or clots. Patient has no known prior surgeries on this left knee. She is not a diabetic  Social Hx: Patient lives at home alone, but states that she has a friend who is planning to check in on her after surgery routinely. She denies any alcohol use, illicit drug use. No smoking or nicotine use.  Medications & Allergies Allergies: Allergies Allergen Reactions Etodolac Swelling Lip swelling / tingling Sulfite Swelling Adhesive Tape-Silicones Rash Augmentin [Amoxicillin-Pot Clavulanate] Nausea Stomach upset Chlorhexidine Itching and Rash Gluconate Salt  Rash Latex Rash  Home Medicines: Current Outpatient Medications on File Prior to Visit Medication Sig Dispense Refill acetaminophen (TYLENOL ORAL) Take by mouth once as needed cholecalciferol, vitamin D3, 125 mcg (5,000 unit) TbDL Take by mouth once daily diphenhydrAMINE (BENADRYL) 25 mg capsule Take 25 mg by mouth every 6 (six) hours as needed for Itching fluticasone propionate (FLONASE) 50 mcg/actuation nasal spray Place 2 sprays into both nostrils once daily as needed for Rhinitis glucosam/chon-msm1/C/mang/bosw (OSTEO BI-FLEX TRIPLE STRENGTH ORAL) Take by mouth once daily HYDROcodone-acetaminophen (NORCO) 5-325 mg tablet Take 1 tablet by mouth every 6 (six) hours as needed for up to 12 doses (Patient not taking: Reported on 12/19/2023) 12 tablet 0 HYDROcodone-acetaminophen (NORCO) 5-325 mg tablet Take 1 tablet by mouth every 6 (six) hours as needed [facility] for bedtime.) for up to 12 doses 12 tablet 0 meloxicam (MOBIC) 15 MG tablet take 1 tablet by mouth every day 30 tablet 11 metaxalone (SKELAXIN) 800 mg tablet May take 1/2-1 whole tablet up to 3 times daily as needed for pain or spasm. 20 tablet 0 multivit-min/iron/FA/vit K/lut (MULTIVITAMIN WOMEN 50 PLUS ORAL) Take by mouth once daily omega 3-dha-epa-fish oil 183.3 mg-75 mg -91.6 mg-306 mg Cap Take by mouth once daily predniSONE (DELTASONE) 20 MG tablet Take 1 tablet twice daily for 5 days, then 1 tablet once daily for 5 days. Take with food. 15 tablet 0 sodium, potassium, and magnesium (SUPREP) oral solution Take 1 Bottle by mouth as directed One kit contains 2 bottles. Take both bottles at the times instructed by your provider. (Patient not taking: Reported on 12/19/2023) 354 mL 0  No current facility-administered medications on file prior to visit.  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 Repeat in 10 years/CTL   Physical Exam  Ht: Wt: BMI: There is no height or weight on file to calculate BMI.  General/Constitutional: No apparent distress: well-nourished and well developed. Eyes: Pupils equal, round with synchronous movement. Lymphatic: No palpable adenopathy. Respiratory: Patient has good chest rise and fall with inspiration and expiration. All lung fields are clear to auscultation bilaterally. There is no Rales, rhonchi or wheezes appreciated. Cardiovascular: Upon auscultation there is a regular rate and rhythm without any murmurs, rubs, gallops or heaves appreciated. There does not appear to be any swelling down the lower extremities. Posterior tibial pulses appreciated bilaterally, 2+. Integumentary: No impressive skin lesions present, except as noted in detailed exam. Neuro/Psych: Normal mood and affect, oriented to person, place and time. Musculoskeletal: see exam below  Left knee exam Upon inspection of the patient's left knee there does not appear to be any skin changes, open abrasions, swelling or redness. There is a slight varus alignment. Upon palpation, the patient reports having pain along the medial aspect of their knee. Patient has 3 degrees off of full extension actively with ROM, and able to flex back to 121 degrees with mild pain. Varus and valgus stress testing shows positive pseudolaxity to valgus stressing. The patella tracks well  within the femoral groove from flexion into extension with mild crepitus appreciated. Anterior and posterior drawer testing negative. Patient is neurovascularly intact down their lower extremity to all dermatomes. Posterior tibial pulses appreciated 2+.  Imaging left Knee Imaging: A series of xrays were ordered and interpreted of the patient's left knee. These images included AP weightbearing, lateral and sunrise views. Upon inspection of AP views, there is noticeable loss of cartilage space, most notably along the medial joint line. There is bone-on-bone articulation with underlying subchondral changes. Osteophyte formation is noted. Overall alignment is neutral. No fractures, lytic lesions or gross deformities appreciated  Assesment and Plan Knee DJD  I have recommended that Maleyah Wahlert undergo left total knee replacement. Consents has been signed. The risks, benefits, prognosis and alternatives including but not limited to DVT, PE, infection, neurovascular injury, failure of the procedure and death were explained to the patient and she is willing to proceed with surgery as described to her by myself. Plan will be for post operative admission of at least 1 midnight for pain control and PT. She will be managed with DVT prophylaxis, antibiotics preoperatively for 24 hours and aggressive in patient rehab.  Pre, intra and post op interventions were discussed. Patient has good understanding  Medication Reconciliation was performed. Discussed cessation of NSAIDs, vitamins and supplements.  A total of 45 minutes was spent reviewing patient's charts, medical reconciliation, discussing/educating the patient about surgical interventions, and answering any questions provided by the patient.  JOSHUA Kendrick Fries, PA [facility] 02/11/2024  Electronically signed by Cassandra Rover, PA at 02/11/2024 5:00 PM EST

## 2024-02-16 ENCOUNTER — Observation Stay
Admission: RE | Admit: 2024-02-16 | Discharge: 2024-02-17 | Disposition: A | Discharge status: Home / Self Care | Payer: Medicaid Other | Source: Ambulatory Visit | Attending: Orthopedic Surgery | Admitting: Orthopedic Surgery

## 2024-02-16 ENCOUNTER — Observation Stay

## 2024-02-16 ENCOUNTER — Other Ambulatory Visit: Payer: Self-pay

## 2024-02-16 ENCOUNTER — Encounter: Payer: Self-pay | Admitting: Orthopedic Surgery

## 2024-02-16 ENCOUNTER — Ambulatory Visit: Payer: Self-pay | Admitting: Anesthesiology

## 2024-02-16 ENCOUNTER — Ambulatory Visit: Payer: Self-pay | Admitting: Urgent Care

## 2024-02-16 ENCOUNTER — Encounter
Admission: RE | Disposition: A | Discharge status: Home / Self Care | Payer: Self-pay | Source: Ambulatory Visit | Attending: Orthopedic Surgery

## 2024-02-16 DIAGNOSIS — Z01812 Encounter for preprocedural laboratory examination: Secondary | ICD-10-CM

## 2024-02-16 DIAGNOSIS — M1712 Unilateral primary osteoarthritis, left knee: Secondary | ICD-10-CM | POA: Diagnosis present

## 2024-02-16 DIAGNOSIS — Z91040 Latex allergy status: Secondary | ICD-10-CM | POA: Diagnosis not present

## 2024-02-16 DIAGNOSIS — Z880 Allergy status to penicillin: Secondary | ICD-10-CM

## 2024-02-16 DIAGNOSIS — Z96652 Presence of left artificial knee joint: Secondary | ICD-10-CM

## 2024-02-16 HISTORY — PX: KNEE ARTHROPLASTY: SHX992

## 2024-02-16 SURGERY — ARTHROPLASTY, KNEE, TOTAL, USING IMAGELESS COMPUTER-ASSISTED NAVIGATION
Anesthesia: Spinal | Site: Knee | Laterality: Left

## 2024-02-16 MED ORDER — PANTOPRAZOLE SODIUM 40 MG PO TBEC
40.0000 mg | DELAYED_RELEASE_TABLET | Freq: Two times a day (BID) | ORAL | Status: DC
  Administered 2024-02-16 – 2024-02-17 (×3): 40 mg via ORAL
  Filled 2024-02-16 (×3): qty 1

## 2024-02-16 MED ORDER — DEXAMETHASONE SODIUM PHOSPHATE 10 MG/ML IJ SOLN
8.0000 mg | Freq: Once | INTRAMUSCULAR | Status: AC
  Administered 2024-02-16: 8 mg via INTRAVENOUS

## 2024-02-16 MED ORDER — ORAL CARE MOUTH RINSE: 15.0000 mL | OROMUCOSAL | Status: DC | PRN

## 2024-02-16 MED ORDER — CEFAZOLIN SODIUM-DEXTROSE 2-4 GM/100ML-% IV SOLN
INTRAVENOUS | Status: AC
  Filled 2024-02-16: qty 100

## 2024-02-16 MED ORDER — OXYCODONE HCL 5 MG/5ML PO SOLN: 5.0000 mg | Freq: Once | ORAL | Status: DC | PRN

## 2024-02-16 MED ORDER — MENTHOL 3 MG MT LOZG: 1.0000 | LOZENGE | OROMUCOSAL | Status: DC | PRN

## 2024-02-16 MED ORDER — ONDANSETRON HCL 4 MG/2ML IJ SOLN: 4.0000 mg | Freq: Four times a day (QID) | INTRAMUSCULAR | Status: DC | PRN

## 2024-02-16 MED ORDER — ALUM & MAG HYDROXIDE-SIMETH 200-200-20 MG/5ML PO SUSP: 30.0000 mL | ORAL | Status: DC | PRN

## 2024-02-16 MED ORDER — TRANEXAMIC ACID-NACL 1000-0.7 MG/100ML-% IV SOLN
1000.0000 mg | INTRAVENOUS | Status: AC
  Administered 2024-02-16: 1000 mg via INTRAVENOUS

## 2024-02-16 MED ORDER — GABAPENTIN 300 MG PO CAPS
300.0000 mg | ORAL_CAPSULE | Freq: Once | ORAL | Status: AC
  Administered 2024-02-16: 300 mg via ORAL

## 2024-02-16 MED ORDER — PROPOFOL 10 MG/ML IV BOLUS
INTRAVENOUS | Status: DC | PRN
  Administered 2024-02-16: 30 mg via INTRAVENOUS

## 2024-02-16 MED ORDER — LACTATED RINGERS IV SOLN: INTRAVENOUS | Status: DC

## 2024-02-16 MED ORDER — PROPOFOL 1000 MG/100ML IV EMUL
INTRAVENOUS | Status: AC
  Filled 2024-02-16: qty 100

## 2024-02-16 MED ORDER — OXYCODONE HCL 5 MG PO TABS
5.0000 mg | ORAL_TABLET | ORAL | Status: DC | PRN
  Administered 2024-02-16 – 2024-02-17 (×4): 5 mg via ORAL
  Filled 2024-02-16 (×4): qty 1

## 2024-02-16 MED ORDER — POLYVINYL ALCOHOL 1.4 % OP SOLN: 1.0000 [drp] | OPHTHALMIC | Status: DC | PRN

## 2024-02-16 MED ORDER — CELECOXIB 200 MG PO CAPS
200.0000 mg | ORAL_CAPSULE | Freq: Two times a day (BID) | ORAL | Status: DC
  Administered 2024-02-16 – 2024-02-17 (×2): 200 mg via ORAL
  Filled 2024-02-16 (×2): qty 1

## 2024-02-16 MED ORDER — CELECOXIB 200 MG PO CAPS
ORAL_CAPSULE | ORAL | Status: AC
  Filled 2024-02-16: qty 2

## 2024-02-16 MED ORDER — TRANEXAMIC ACID-NACL 1000-0.7 MG/100ML-% IV SOLN
INTRAVENOUS | Status: AC
  Filled 2024-02-16: qty 100

## 2024-02-16 MED ORDER — BUPIVACAINE HCL (PF) 0.25 % IJ SOLN
INTRAMUSCULAR | Status: DC | PRN
  Administered 2024-02-16: 60 mL

## 2024-02-16 MED ORDER — ACETAMINOPHEN 10 MG/ML IV SOLN
1000.0000 mg | Freq: Four times a day (QID) | INTRAVENOUS | Status: AC
  Administered 2024-02-16 – 2024-02-17 (×4): 1000 mg via INTRAVENOUS
  Filled 2024-02-16 (×3): qty 100

## 2024-02-16 MED ORDER — SODIUM CHLORIDE 0.9 % IR SOLN
Status: DC | PRN
  Administered 2024-02-16: 3000 mL

## 2024-02-16 MED ORDER — ACETAMINOPHEN 10 MG/ML IV SOLN: 1000.0000 mg | Freq: Once | INTRAVENOUS | Status: DC | PRN

## 2024-02-16 MED ORDER — GABAPENTIN 300 MG PO CAPS
ORAL_CAPSULE | ORAL | Status: AC
  Filled 2024-02-16: qty 1

## 2024-02-16 MED ORDER — DIPHENHYDRAMINE HCL 50 MG/ML IJ SOLN
INTRAMUSCULAR | Status: AC
  Filled 2024-02-16: qty 1

## 2024-02-16 MED ORDER — ENSURE PRE-SURGERY PO LIQD
296.0000 mL | Freq: Once | ORAL | Status: AC
  Administered 2024-02-16: 296 mL via ORAL
  Filled 2024-02-16: qty 296

## 2024-02-16 MED ORDER — MAGNESIUM HYDROXIDE 400 MG/5ML PO SUSP
30.0000 mL | Freq: Every day | ORAL | Status: DC
  Administered 2024-02-17: 30 mL via ORAL
  Filled 2024-02-16: qty 30

## 2024-02-16 MED ORDER — DEXAMETHASONE SODIUM PHOSPHATE 10 MG/ML IJ SOLN
INTRAMUSCULAR | Status: AC
  Filled 2024-02-16: qty 1

## 2024-02-16 MED ORDER — TRANEXAMIC ACID-NACL 1000-0.7 MG/100ML-% IV SOLN
1000.0000 mg | Freq: Once | INTRAVENOUS | Status: AC
  Administered 2024-02-16: 1000 mg via INTRAVENOUS

## 2024-02-16 MED ORDER — DIPHENHYDRAMINE HCL 50 MG/ML IJ SOLN
INTRAMUSCULAR | Status: DC | PRN
  Administered 2024-02-16: 12.5 mg via INTRAVENOUS

## 2024-02-16 MED ORDER — FENTANYL CITRATE (PF) 100 MCG/2ML IJ SOLN
INTRAMUSCULAR | Status: AC
Start: 2024-02-16 — End: ?
  Filled 2024-02-16: qty 2

## 2024-02-16 MED ORDER — SODIUM CHLORIDE 0.9 % IV SOLN: INTRAVENOUS | Status: DC

## 2024-02-16 MED ORDER — FLEET ENEMA RE ENEM: 1.0000 | ENEMA | Freq: Once | RECTAL | Status: DC | PRN

## 2024-02-16 MED ORDER — CEFAZOLIN SODIUM-DEXTROSE 2-4 GM/100ML-% IV SOLN
2.0000 g | Freq: Four times a day (QID) | INTRAVENOUS | Status: AC
  Administered 2024-02-16 (×2): 2 g via INTRAVENOUS
  Filled 2024-02-16 (×2): qty 100

## 2024-02-16 MED ORDER — ASPIRIN 81 MG PO CHEW
81.0000 mg | CHEWABLE_TABLET | Freq: Two times a day (BID) | ORAL | Status: DC
  Administered 2024-02-17: 81 mg via ORAL
  Filled 2024-02-16: qty 1

## 2024-02-16 MED ORDER — METOCLOPRAMIDE HCL 10 MG PO TABS
10.0000 mg | ORAL_TABLET | Freq: Three times a day (TID) | ORAL | Status: DC
  Administered 2024-02-16 – 2024-02-17 (×4): 10 mg via ORAL
  Filled 2024-02-16 (×4): qty 1

## 2024-02-16 MED ORDER — PHENOL 1.4 % MT LIQD: 1.0000 | OROMUCOSAL | Status: DC | PRN

## 2024-02-16 MED ORDER — OXYCODONE HCL 5 MG PO TABS: 5.0000 mg | ORAL_TABLET | Freq: Once | ORAL | Status: DC | PRN

## 2024-02-16 MED ORDER — SURGIRINSE WOUND IRRIGATION SYSTEM - OPTIME
TOPICAL | Status: DC | PRN
  Administered 2024-02-16: 450 mL via TOPICAL

## 2024-02-16 MED ORDER — SENNOSIDES-DOCUSATE SODIUM 8.6-50 MG PO TABS
1.0000 | ORAL_TABLET | Freq: Two times a day (BID) | ORAL | Status: DC
  Administered 2024-02-16 – 2024-02-17 (×2): 1 via ORAL
  Filled 2024-02-16 (×2): qty 1

## 2024-02-16 MED ORDER — FENTANYL CITRATE (PF) 100 MCG/2ML IJ SOLN
INTRAMUSCULAR | Status: DC | PRN
  Administered 2024-02-16 (×2): 50 ug via INTRAVENOUS

## 2024-02-16 MED ORDER — MIDAZOLAM HCL 2 MG/2ML IJ SOLN
INTRAMUSCULAR | Status: AC
  Filled 2024-02-16: qty 2

## 2024-02-16 MED ORDER — DROPERIDOL 2.5 MG/ML IJ SOLN: 0.6250 mg | Freq: Once | INTRAMUSCULAR | Status: DC | PRN

## 2024-02-16 MED ORDER — LORATADINE 10 MG PO TABS
10.0000 mg | ORAL_TABLET | Freq: Every day | ORAL | Status: DC
  Administered 2024-02-16 – 2024-02-17 (×2): 10 mg via ORAL
  Filled 2024-02-16 (×2): qty 1

## 2024-02-16 MED ORDER — CHLORHEXIDINE GLUCONATE 4 % EX SOLN
60.0000 mL | Freq: Once | CUTANEOUS | Status: AC
  Administered 2024-02-16: 4 via TOPICAL

## 2024-02-16 MED ORDER — OXYCODONE HCL 5 MG PO TABS
10.0000 mg | ORAL_TABLET | ORAL | Status: DC | PRN
  Administered 2024-02-16: 10 mg via ORAL
  Filled 2024-02-16: qty 2

## 2024-02-16 MED ORDER — TRAMADOL HCL 50 MG PO TABS
50.0000 mg | ORAL_TABLET | ORAL | Status: DC | PRN
  Administered 2024-02-16 – 2024-02-17 (×2): 100 mg via ORAL
  Filled 2024-02-16 (×2): qty 2

## 2024-02-16 MED ORDER — CEFAZOLIN SODIUM-DEXTROSE 2-4 GM/100ML-% IV SOLN
2.0000 g | INTRAVENOUS | Status: AC
Start: 2024-02-16 — End: 2024-02-16
  Administered 2024-02-16: 2 g via INTRAVENOUS

## 2024-02-16 MED ORDER — ONDANSETRON HCL 4 MG PO TABS: 4.0000 mg | ORAL_TABLET | Freq: Four times a day (QID) | ORAL | Status: DC | PRN

## 2024-02-16 MED ORDER — MIDAZOLAM HCL 5 MG/5ML IJ SOLN
INTRAMUSCULAR | Status: DC | PRN
  Administered 2024-02-16: .5 mg via INTRAVENOUS
  Administered 2024-02-16: 1.5 mg via INTRAVENOUS

## 2024-02-16 MED ORDER — PHENYLEPHRINE HCL-NACL 20-0.9 MG/250ML-% IV SOLN
INTRAVENOUS | Status: DC | PRN
  Administered 2024-02-16: 20 ug/min via INTRAVENOUS

## 2024-02-16 MED ORDER — BISACODYL 10 MG RE SUPP: 10.0000 mg | Freq: Every day | RECTAL | Status: DC | PRN

## 2024-02-16 MED ORDER — DIPHENHYDRAMINE HCL 12.5 MG/5ML PO ELIX: 12.5000 mg | ORAL_SOLUTION | ORAL | Status: DC | PRN

## 2024-02-16 MED ORDER — SODIUM CHLORIDE 0.9 % IV SOLN
INTRAVENOUS | Status: DC | PRN
  Administered 2024-02-16: 60 mL

## 2024-02-16 MED ORDER — ACETAMINOPHEN 10 MG/ML IV SOLN
INTRAVENOUS | Status: DC | PRN
  Administered 2024-02-16: 1000 mg via INTRAVENOUS

## 2024-02-16 MED ORDER — FLUTICASONE PROPIONATE 50 MCG/ACT NA SUSP: 2.0000 | Freq: Every day | NASAL | Status: DC | PRN

## 2024-02-16 MED ORDER — PHENYLEPHRINE HCL-NACL 20-0.9 MG/250ML-% IV SOLN
INTRAVENOUS | Status: AC
Start: 2024-02-16 — End: ?
  Filled 2024-02-16: qty 250

## 2024-02-16 MED ORDER — FENTANYL CITRATE (PF) 100 MCG/2ML IJ SOLN: 25.0000 ug | INTRAMUSCULAR | Status: DC | PRN

## 2024-02-16 MED ORDER — BUPIVACAINE HCL (PF) 0.5 % IJ SOLN
INTRAMUSCULAR | Status: AC
Start: 2024-02-16 — End: ?
  Filled 2024-02-16: qty 10

## 2024-02-16 MED ORDER — CELECOXIB 200 MG PO CAPS
400.0000 mg | ORAL_CAPSULE | Freq: Once | ORAL | Status: AC
Start: 2024-02-16 — End: 2024-02-16
  Administered 2024-02-16: 400 mg via ORAL

## 2024-02-16 MED ORDER — ACETAMINOPHEN 325 MG PO TABS: 325.0000 mg | ORAL_TABLET | Freq: Four times a day (QID) | ORAL | Status: DC | PRN

## 2024-02-16 MED ORDER — HYDROMORPHONE HCL 1 MG/ML IJ SOLN: 0.5000 mg | INTRAMUSCULAR | Status: DC | PRN

## 2024-02-16 MED ORDER — FERROUS SULFATE 325 (65 FE) MG PO TABS
325.0000 mg | ORAL_TABLET | Freq: Two times a day (BID) | ORAL | Status: DC
  Administered 2024-02-16 – 2024-02-17 (×2): 325 mg via ORAL
  Filled 2024-02-16 (×2): qty 1

## 2024-02-16 MED ORDER — BUPIVACAINE HCL (PF) 0.5 % IJ SOLN
INTRAMUSCULAR | Status: DC | PRN
  Administered 2024-02-16: 2.8 mL

## 2024-02-16 MED ORDER — PROPOFOL 500 MG/50ML IV EMUL
INTRAVENOUS | Status: DC | PRN
  Administered 2024-02-16: 75 ug/kg/min via INTRAVENOUS

## 2024-02-16 SURGICAL SUPPLY — 64 items
ATTUNE MED DOME PAT 38 KNEE (Knees) IMPLANT
ATTUNE PSFEM LTSZ5 NARCEM KNEE (Femur) IMPLANT
ATTUNE PSRP INSR SZ5 5 KNEE (Insert) IMPLANT
BASEPLATE TIBIAL ROTATING SZ 4 (Knees) IMPLANT
BATTERY INSTRU NAVIGATION (MISCELLANEOUS) ×4 IMPLANT
BIT DRILL QUICK REL 1/8 2PK SL (BIT) ×1 IMPLANT
BLADE CLIPPER SURG (BLADE) IMPLANT
BLADE SAW 70X12.5 (BLADE) ×1 IMPLANT
BLADE SAW 90X13X1.19 OSCILLAT (BLADE) ×1 IMPLANT
BLADE SAW 90X25X1.19 OSCILLAT (BLADE) ×1 IMPLANT
BRUSH SCRUB EZ PLAIN DRY (MISCELLANEOUS) ×1 IMPLANT
CEMENT HV SMART SET (Cement) IMPLANT
COOLER POLAR GLACIER W/PUMP (MISCELLANEOUS) ×1 IMPLANT
CUFF TRNQT CYL 24X4X16.5-23 (TOURNIQUET CUFF) IMPLANT
CUFF TRNQT CYL 30X4X21-28X (TOURNIQUET CUFF) IMPLANT
DRAPE SHEET LG 3/4 BI-LAMINATE (DRAPES) ×1 IMPLANT
DRSG AQUACEL AG ADV 3.5X14 (GAUZE/BANDAGES/DRESSINGS) ×1 IMPLANT
DRSG MEPILEX SACRM 8.7X9.8 (GAUZE/BANDAGES/DRESSINGS) ×1 IMPLANT
DRSG TEGADERM 4X4.75 (GAUZE/BANDAGES/DRESSINGS) ×1 IMPLANT
DURAPREP 26ML APPLICATOR (WOUND CARE) ×2 IMPLANT
ELECT CAUTERY BLADE 6.4 (BLADE) ×1 IMPLANT
ELECT REM PT RETURN 9FT ADLT (ELECTROSURGICAL) ×1 IMPLANT
ELECTRODE REM PT RTRN 9FT ADLT (ELECTROSURGICAL) ×1 IMPLANT
EVACUATOR 1/8 PVC DRAIN (DRAIN) ×1 IMPLANT
EX-PIN ORTHOLOCK NAV 4X150 (PIN) ×2 IMPLANT
GAUZE XEROFORM 1X8 LF (GAUZE/BANDAGES/DRESSINGS) ×1 IMPLANT
GLOVE BIOGEL M STRL SZ7.5 (GLOVE) ×6 IMPLANT
GLOVE SURG UNDER POLY LF SZ8 (GLOVE) ×2 IMPLANT
GOWN STRL REUS W/ TWL LRG LVL3 (GOWN DISPOSABLE) ×1 IMPLANT
GOWN STRL REUS W/ TWL XL LVL3 (GOWN DISPOSABLE) ×1 IMPLANT
GOWN TOGA ZIPPER T7+ PEEL AWAY (MISCELLANEOUS) ×1 IMPLANT
HOLDER FOLEY CATH W/STRAP (MISCELLANEOUS) ×1 IMPLANT
HOOD PEEL AWAY T7 (MISCELLANEOUS) ×1 IMPLANT
IV NS IRRIG 3000ML ARTHROMATIC (IV SOLUTION) ×1 IMPLANT
KIT TURNOVER KIT A (KITS) ×1 IMPLANT
KNIFE SCULPS 14X20 (INSTRUMENTS) ×1 IMPLANT
MANIFOLD NEPTUNE II (INSTRUMENTS) ×2 IMPLANT
NDL SPNL 20GX3.5 QUINCKE YW (NEEDLE) ×2 IMPLANT
NEEDLE SPNL 20GX3.5 QUINCKE YW (NEEDLE) ×2 IMPLANT
PACK TOTAL KNEE (MISCELLANEOUS) ×1 IMPLANT
PAD ABD DERMACEA PRESS 5X9 (GAUZE/BANDAGES/DRESSINGS) ×2 IMPLANT
PAD ARMBOARD 7.5X6 YLW CONV (MISCELLANEOUS) ×3 IMPLANT
PAD WRAPON POLAR KNEE (MISCELLANEOUS) ×1 IMPLANT
PENCIL SMOKE EVACUATOR COATED (MISCELLANEOUS) ×1 IMPLANT
PIN DRILL FIX HALF THREAD (BIT) ×2 IMPLANT
PIN FIXATION 1/8DIA X 3INL (PIN) ×1 IMPLANT
PULSAVAC PLUS IRRIG FAN TIP (DISPOSABLE) ×1 IMPLANT
SOLUTION IRRIG SURGIPHOR (IV SOLUTION) ×1 IMPLANT
SPONGE DRAIN TRACH 4X4 STRL 2S (GAUZE/BANDAGES/DRESSINGS) ×1 IMPLANT
STAPLER SKIN PROX 35W (STAPLE) ×1 IMPLANT
STOCKINETTE STRL BIAS CUT 8X4 (MISCELLANEOUS) ×1 IMPLANT
STRAP TIBIA SHORT (MISCELLANEOUS) ×1 IMPLANT
SUCTION TUBE FRAZIER 10FR DISP (SUCTIONS) ×1 IMPLANT
SUT VIC AB 0 CT1 36 (SUTURE) ×1 IMPLANT
SUT VIC AB 1 CT1 36 (SUTURE) ×2 IMPLANT
SUT VIC AB 2-0 CT2 27 (SUTURE) ×1 IMPLANT
SYR 30ML LL (SYRINGE) ×2 IMPLANT
TIP FAN IRRIG PULSAVAC PLUS (DISPOSABLE) ×1 IMPLANT
TOWEL OR 17X26 4PK STRL BLUE (TOWEL DISPOSABLE) ×1 IMPLANT
TOWER CARTRIDGE SMART MIX (DISPOSABLE) ×1 IMPLANT
TRAP FLUID SMOKE EVACUATOR (MISCELLANEOUS) ×1 IMPLANT
TRAY FOLEY MTR SLVR 16FR STAT (SET/KITS/TRAYS/PACK) ×1 IMPLANT
WATER STERILE IRR 1000ML POUR (IV SOLUTION) ×1 IMPLANT
WRAPON POLAR PAD KNEE (MISCELLANEOUS) ×1 IMPLANT

## 2024-02-16 NOTE — Anesthesia Preprocedure Evaluation (Addendum)
 Anesthesia Evaluation  Patient identified by MRN, date of birth, ID band Patient awake    Reviewed: Allergy & Precautions, NPO status , Patient's Chart, lab work & pertinent test results  History of Anesthesia Complications Negative for: history of anesthetic complications  Airway Mallampati: II  TM Distance: >3 FB Neck ROM: full    Dental no notable dental hx.    Pulmonary neg pulmonary ROS   Pulmonary exam normal        Cardiovascular Exercise Tolerance: Good Normal cardiovascular exam  Mitral valve insufficiency and aortic valve insufficiency   Neuro/Psych negative neurological ROS  negative psych ROS   GI/Hepatic Neg liver ROS,GERD  Controlled,,  Endo/Other  negative endocrine ROS    Renal/GU negative Renal ROS  negative genitourinary   Musculoskeletal  (+) Arthritis ,    Abdominal Normal abdominal exam  (+)   Peds  Hematology negative hematology ROS (+)   Anesthesia Other Findings Past Medical History: No date: Abnormal Pap smear of cervix No date: Dense breast tissue No date: GERD (gastroesophageal reflux disease) No date: VAIN (vaginal intraepithelial neoplasia)     Comment:  h/o VAIN II in 2008, treated with Effudex, 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  Past Surgical History: No date: ABDOMINAL HYSTERECTOMY No date: CHOLECYSTECTOMY  BMI    Body Mass Index: 27.64 kg/m      Reproductive/Obstetrics negative OB ROS                             Anesthesia Physical Anesthesia Plan  ASA: 2  Anesthesia Plan: General/Spinal   Post-op Pain Management: Regional block* and Ofirmev IV (intra-op)*   Induction: Intravenous  PONV Risk Score and Plan: 2 and Propofol infusion and TIVA  Airway Management Planned: Natural Airway and Nasal Cannula  Additional Equipment:   Intra-op Plan:   Post-operative Plan:   Informed Consent: I have  reviewed the patients History and Physical, chart, labs and discussed the procedure including the risks, benefits and alternatives for the proposed anesthesia with the patient or authorized representative who has indicated his/her understanding and acceptance.     Dental Advisory Given  Plan Discussed with: Anesthesiologist, CRNA and Surgeon  Anesthesia Plan Comments: (Patient consented for risks of anesthesia including but not limited to:  - adverse reactions to medications - risk of airway placement if required - damage to eyes, teeth, lips or other oral mucosa - nerve damage due to positioning  - sore throat or hoarseness - Damage to heart, brain, nerves, lungs, other parts of body or loss of life  Patient voiced understanding and assent.)       Anesthesia Quick Evaluation

## 2024-02-16 NOTE — Interval H&P Note (Signed)
 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  The various methods of treatment have been discussed with the patient and family. After consideration of risks, benefits and other options for treatment, the patient has consented to  Procedure(s): ARTHROPLASTY, KNEE, TOTAL, USING IMAGELESS COMPUTER-ASSISTED NAVIGATION (Left) as a surgical intervention.  The patient's history has been reviewed, patient examined, no change in status, stable for surgery.  I have reviewed the patient's chart and labs.  Questions were answered to the patient's satisfaction.     Callie Facey P Janea Schwenn

## 2024-02-16 NOTE — Op Note (Signed)
 OPERATIVE NOTE  DATE OF SURGERY:  02/16/2024  PATIENT NAME:  Cassandra Wang   DOB: 11-08-1966  MRN: [id number]  PRE-OPERATIVE DIAGNOSIS: Degenerative arthrosis of the left knee, primary  POST-OPERATIVE DIAGNOSIS:  Same  PROCEDURE:  Left total knee arthroplasty using computer-assisted navigation  SURGEON:  Jena Gauss. M.D.  ASSISTANT:  Gean Birchwood, PA-C (present and scrubbed throughout the case, critical for assistance with exposure, retraction, instrumentation, and closure)  ANESTHESIA: spinal  ESTIMATED BLOOD LOSS: 50 mL  FLUIDS REPLACED: 900 mL of crystalloid  TOURNIQUET TIME: 73 minutes  DRAINS: 2 medium Hemovac drains  SOFT TISSUE RELEASES: Anterior cruciate ligament, posterior cruciate ligament, deep medial collateral ligament, patellofemoral ligament  IMPLANTS UTILIZED: DePuy Attune size 5N posterior stabilized femoral component (cemented), size 4 rotating platform tibial component (cemented), 38 mm medialized dome patella (cemented), and a 5 mm stabilized rotating platform polyethylene insert.  INDICATIONS FOR SURGERY: Cassandra Wang is a 58 y.o. year old female with a long history of progressive knee pain. X-rays demonstrated severe degenerative changes in tricompartmental fashion. The patient had not seen any significant improvement despite conservative nonsurgical intervention. After discussion of the risks and benefits of surgical intervention, the patient expressed understanding of the risks benefits and agree with plans for total knee arthroplasty.   The risks, benefits, and alternatives were discussed at length including but not limited to the risks of infection, bleeding, nerve injury, stiffness, blood clots, the need for revision surgery, cardiopulmonary complications, among others, and they were willing to proceed.  PROCEDURE IN DETAIL: The patient was brought into the operating room and, after adequate spinal anesthesia was achieved, a tourniquet was  placed on the patient's upper thigh. The patient's knee and leg were cleaned and prepped with alcohol and DuraPrep and draped in the usual sterile fashion. A "timeout" was performed as per usual protocol. The lower extremity was exsanguinated using an Esmarch, and the tourniquet was inflated to 300 mmHg. An anterior longitudinal incision was made followed by a standard mid vastus approach. The deep fibers of the medial collateral ligament were elevated in a subperiosteal fashion off of the medial flare of the tibia so as to maintain a continuous soft tissue sleeve. The patella was subluxed laterally and the patellofemoral ligament was incised. Inspection of the knee demonstrated severe degenerative changes with full-thickness loss of articular cartilage. Osteophytes were debrided using a rongeur. Anterior and posterior cruciate ligaments were excised. Two 4.0 mm Schanz pins were inserted in the femur and into the tibia for attachment of the array of trackers used for computer-assisted navigation. Hip center was identified using a circumduction technique. Distal landmarks were mapped using the computer. The distal femur and proximal tibia were mapped using the computer. The distal femoral cutting guide was positioned using computer-assisted navigation so as to achieve a 5 distal valgus cut. The femur was sized and it was felt that a size 5N femoral component was appropriate. A size 5 femoral cutting guide was positioned and the anterior cut was performed and verified using the computer. This was followed by completion of the posterior and chamfer cuts. Femoral cutting guide for the central box was then positioned in the center box cut was performed.  Attention was then directed to the proximal tibia. Medial and lateral menisci were excised. The extramedullary tibial cutting guide was positioned using computer-assisted navigation so as to achieve a 0 varus-valgus alignment and 3 posterior slope. The cut was  performed and verified using the computer. The proximal tibia  was sized and it was felt that a size 4 tibial tray was appropriate. Tibial and femoral trials were inserted followed by insertion of a 5 mm polyethylene insert. This allowed for excellent mediolateral soft tissue balancing both in flexion and in full extension. Finally, the patella was cut and prepared so as to accommodate a 38 mm medialized dome patella. A patella trial was placed and the knee was placed through a range of motion with excellent patellar tracking appreciated. The femoral trial was removed after debridement of posterior osteophytes. The central post-hole for the tibial component was reamed followed by insertion of a keel punch. Tibial trials were then removed. Cut surfaces of bone were irrigated with copious amounts of normal saline using pulsatile lavage and then suctioned dry. Polymethylmethacrylate cement was prepared in the usual fashion using a vacuum mixer. Cement was applied to the cut surface of the proximal tibia as well as along the undersurface of a size 4 rotating platform tibial component. Tibial component was positioned and impacted into place. Excess cement was removed using Personal assistant. Cement was then applied to the cut surfaces of the femur as well as along the posterior flanges of the size 5N femoral component. The femoral component was positioned and impacted into place. Excess cement was removed using Personal assistant. A 5 mm polyethylene trial was inserted and the knee was brought into full extension with steady axial compression applied. Finally, cement was applied to the backside of a 38 mm medialized dome patella and the patellar component was positioned and patellar clamp applied. Excess cement was removed using Personal assistant. After adequate curing of the cement, the tourniquet was deflated after a total tourniquet time of 73 minutes. Hemostasis was achieved using electrocautery. The knee was irrigated with  copious amounts of normal saline using pulsatile lavage followed by 450 ml of Surgiphor and then suctioned dry. 20 mL of 1.3% Exparel and 60 mL of 0.25% Marcaine in 40 mL of normal saline was injected along the posterior capsule, medial and lateral gutters, and along the arthrotomy site. A 5 mm stabilized rotating platform polyethylene insert was inserted and the knee was placed through a range of motion with excellent mediolateral soft tissue balancing appreciated and excellent patellar tracking noted. 2 medium drains were placed in the wound bed and brought out through separate stab incisions. The medial parapatellar portion of the incision was reapproximated using interrupted sutures of #1 Vicryl. Subcutaneous tissue was approximated in layers using first #0 Vicryl followed #2-0 Vicryl. The skin was approximated with skin staples. A sterile dressing was applied.  The patient tolerated the procedure well and was transported to the recovery room in stable condition.    Jenalee Trevizo P. Angie Fava., M.D.

## 2024-02-16 NOTE — Progress Notes (Signed)
 Patient is not able to walk the distance required to go the bathroom, or he/she is unable to safely negotiate stairs required to access the bathroom.  A 3in1 BSC will alleviate this problem   Amenda Duclos P. Angie Fava M.D.

## 2024-02-16 NOTE — Transfer of Care (Signed)
 Immediate Anesthesia Transfer of Care Note  Patient: Cassandra Wang  Procedure(s) Performed: ARTHROPLASTY, KNEE, TOTAL, USING IMAGELESS COMPUTER-ASSISTED NAVIGATION (Left: Knee)  Patient Location: PACU  Anesthesia Type:Spinal  Level of Consciousness: awake, alert , and oriented  Airway & Oxygen Therapy: Patient Spontanous Breathing and Patient connected to face mask oxygen  Post-op Assessment: Report given to RN and Post -op Vital signs reviewed and stable  Post vital signs: Reviewed and stable  Last Vitals:  Vitals Value Taken Time  BP    Temp    Pulse    Resp    SpO2      Last Pain:  Vitals:   02/16/24 0638  TempSrc: Tympanic         Complications: No notable events documented.

## 2024-02-16 NOTE — Anesthesia Procedure Notes (Addendum)
 Spinal  Patient location during procedure: OR Start time: 02/16/2024 7:20 AM End time: 02/16/2024 7:23 AM Reason for block: surgical anesthesia Staffing Performed: resident/CRNA  Resident/CRNA: Malva Cogan, CRNA Performed by: Malva Cogan, CRNA Authorized by: Foye Deer, MD   Preanesthetic Checklist Completed: patient identified, IV checked, site marked, risks and benefits discussed, surgical consent, monitors and equipment checked, pre-op evaluation and timeout performed Spinal Block Patient position: sitting Prep: ChloraPrep and tolerated CHG scrub in pre-op, also in OR. Patient monitoring: heart rate, continuous pulse ox, blood pressure and cardiac monitor Approach: midline Location: L3-4 Injection technique: single-shot Needle Needle type: Whitacre and Introducer  Needle gauge: 24 G Needle length: 9 cm Assessment Sensory level: T10 Events: CSF return Additional Notes Sterile aseptic technique used throughout the procedure.  Negative paresthesia. Negative blood return. Positive free-flowing CSF. Expiration date of kit checked and confirmed. Patient tolerated procedure well, without complications.

## 2024-02-16 NOTE — Progress Notes (Signed)
 Subjective: 1 Day Post-Op Procedure(s) (LRB): ARTHROPLASTY, KNEE, TOTAL, USING IMAGELESS COMPUTER-ASSISTED NAVIGATION (Left) Patient reports pain as mild.   Patient seen in rounds with Dr. Ernest Pine. Patient is well, and has had no acute complaints or problems. Denies any CP, SOB, N/V, fevers or chills. We will start therapy today.  Plan is to go Home after hospital stay.  Objective: Vital signs in last 24 hours: Temp:  [97 F (36.1 C)-98.8 F (37.1 C)] 98.1 F (36.7 C) (03/11 0756) Pulse Rate:  [60-74] 65 (03/11 0756) Resp:  [10-18] 16 (03/11 0756) BP: (94-117)/(51-77) 106/51 (03/11 0756) SpO2:  [96 %-100 %] 100 % (03/11 0756)  Intake/Output from previous day:  Intake/Output Summary (Last 24 hours) at 02/17/2024 0830 Last data filed at 02/16/2024 1944 Gross per 24 hour  Intake 1558.09 ml  Output 2820 ml  Net -1261.91 ml    Intake/Output this shift: No intake/output data recorded.  Labs: No results for input(s): "HGB" in the last 72 hours. No results for input(s): "WBC", "RBC", "HCT", "PLT" in the last 72 hours. No results for input(s): "NA", "K", "CL", "CO2", "BUN", "CREATININE", "GLUCOSE", "CALCIUM" in the last 72 hours. No results for input(s): "LABPT", "INR" in the last 72 hours.  EXAM General - Patient is Alert, Appropriate, and Oriented Extremity - Neurologically intact Neurovascular intact Sensation intact distally Intact pulses distally Dorsiflexion/Plantar flexion intact No cellulitis present Compartment soft Dressing - dressing C/D/I and no drainage Motor Function - intact, moving foot and toes well on exam.  JP Drain pulled without difficulty. 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    Assessment/Plan: 1 Day Post-Op Procedure(s) (LRB): ARTHROPLASTY, KNEE, TOTAL, USING IMAGELESS COMPUTER-ASSISTED NAVIGATION (Left) Principal Problem:   History of total knee arthroplasty, left  Estimated body mass index is 28.15 kg/m as calculated from the following:   Height as of this encounter: 5\' 4"  (1.626 m).   Weight as of this encounter: 74.4 kg. Advance diet D/C IV fluids  Patient will continue to work with physical therapy to pass postoperative PT protocols, ROM and strengthening  Discussed with the patient continuing to utilize Polar Care  Patient will use bone foam in 20-30 minute intervals  Patient will wear TED hose bilaterally to help prevent DVT and clot formation  Discussed the Aquacel bandage.  This bandage will stay in place 7 days postoperatively.  Can be replaced with honeycomb bandages that will be sent home with the patient  Discussed sending the patient home with tramadol and oxycodone for as needed pain management.  Patient will also be continue with meloxicam to help with swelling and inflammation.  Patient will take an 81 mg aspirin twice daily for DVT prophylaxis  JP drain removed without difficulty, intact  Weight-Bearing as tolerated to left leg  Patient will follow-up with [facility] orthopedics in 2 weeks for staple removal and reevaluation  Rayburn Go, PA-C [facility] 02/17/2024, 8:30 AM

## 2024-02-16 NOTE — Evaluation (Signed)
 Physical Therapy Evaluation Patient Details Name: Cassandra Wang MRN: [id number] DOB: 09/07/1966 Today's Date: 02/16/2024  History of Present Illness  Pt is a 58 yo F diagnosed with degenerative arthrosis of the left knee and is s/p elective L TKA.  PMH includes aortic insufficiency, chest pain, and DJD.  Clinical Impression  Pt was pleasant and motivated to participate during the session and put forth good effort throughout. Pt required extra time, effort, and occasional cuing for proper sequencing during functional training per below but required no physical assist during the session.  Pt was generally steady with all standing activities with no overt LOB and no L knee buckling.  Pt was able to amb near the EOB and from bed to chair with no adverse symptoms other than mild L knee pain and with SpO2 and HR WNL on room air.  Pt will benefit from continued PT services upon discharge to safely address deficits listed in patient problem list for decreased caregiver assistance and eventual return to PLOF.          If plan is discharge home, recommend the following: A little help with walking and/or transfers;A little help with bathing/dressing/bathroom;Assistance with cooking/housework;Help with stairs or ramp for entrance;Assist for transportation   Can travel by private vehicle        Equipment Recommendations Rolling walker (2 wheels)  Recommendations for Other Services       Functional Status Assessment Patient has had a recent decline in their functional status and demonstrates the ability to make significant improvements in function in a reasonable and predictable amount of time.     Precautions / Restrictions Precautions Precautions: Fall Restrictions Weight Bearing Restrictions Per Provider Order: Yes LLE Weight Bearing Per Provider Order: Weight bearing as tolerated Other Position/Activity Restrictions: Pt able to perform Ind LLE SLR without extensor lag, no KI required       Mobility  Bed Mobility Overal bed mobility: Modified Independent             General bed mobility comments: Min extra time and effort only    Transfers Overall transfer level: Needs assistance Equipment used: Rolling walker (2 wheels) Transfers: Sit to/from Stand Sit to Stand: Contact guard assist           General transfer comment: Mod multi-modal cues for proper sequencing with pt able to transfer to/from various surfaces with good carryover of proper sequencing and with good control and stability    Ambulation/Gait Ambulation/Gait assistance: Contact guard assist Gait Distance (Feet): 10 Feet Assistive device: Rolling walker (2 wheels) Gait Pattern/deviations: Step-to pattern, Decreased step length - right, Decreased stance time - left, Antalgic Gait velocity: decreased     General Gait Details: Mildly antalgic step-to gait pattern but steady without overt LOB or buckling  Stairs            Wheelchair Mobility     Tilt Bed    Modified Rankin (Stroke Patients Only)       Balance Overall balance assessment: Needs assistance   Sitting balance-Leahy Scale: Normal     Standing balance support: Bilateral upper extremity supported, During functional activity Standing balance-Leahy Scale: Good                               Pertinent Vitals/Pain Pain Assessment Pain Assessment: 0-10 Pain Score: 3  Pain Location: L knee Pain Descriptors / Indicators: Sore Pain Intervention(s): Premedicated before session, Monitored during session, Repositioned,  Ice applied    Home Living Family/patient expects to be discharged to:: Private residence Living Arrangements: Alone Available Help at Discharge: Friend(s);Available 24 hours/day;Available PRN/intermittently Type of Home: House Home Access: Stairs to enter Entrance Stairs-Rails: None Entrance Stairs-Number of Steps: 1   Home Layout: One level   Additional Comments: Owns a walking stick.   Will have a friend staying with her 24/7 at discharge for several days and then will have intermittent assist from friends after that.    Prior Function Prior Level of Function : Independent/Modified Independent             Mobility Comments: Ind amb community distances without an AD, owns a walking stick that she rarely uses when she has leg cramps, no fall history ADLs Comments: Ind with ADLs     Extremity/Trunk Assessment   Upper Extremity Assessment Upper Extremity Assessment: Overall WFL for tasks assessed    Lower Extremity Assessment Lower Extremity Assessment: LLE deficits/detail LLE Deficits / Details: BLE ankle strength, AROM, and sensation to light touch grossly WNL; LLE hip flex strength >/= 3/5 LLE: Unable to fully assess due to pain LLE Sensation: WNL LLE Coordination: WNL       Communication   Communication Communication: No apparent difficulties    Cognition Arousal: Alert Behavior During Therapy: WFL for tasks assessed/performed   PT - Cognitive impairments: No apparent impairments                         Following commands: Intact       Cueing Cueing Techniques: Verbal cues, Visual cues     General Comments      Exercises Total Joint Exercises Ankle Circles/Pumps: AROM, Strengthening, Both, 10 reps Quad Sets: AROM, Strengthening, Left, 10 reps, 5 reps Straight Leg Raises: AROM, Strengthening, Left, 5 reps Long Arc Quad: AROM, Strengthening, Left, 10 reps, 5 reps Knee Flexion: AROM, Strengthening, Left, 5 reps, 10 reps Goniometric ROM: L knee AROM: 11-66 deg (pt reported flexion feeling limited by various bandages/wraps) Marching in Standing: AROM, Strengthening, Both, 5 reps, Standing Other Exercises Other Exercises: HEP education per handout with focus on QS and seated knee flex Other Exercises: Positioning education to promote L knee ext PROM and prevent heel pressure   Assessment/Plan    PT Assessment Patient needs  continued PT services  PT Problem List Decreased strength;Decreased range of motion;Decreased activity tolerance;Decreased balance;Decreased mobility;Decreased knowledge of use of DME;Pain       PT Treatment Interventions DME instruction;Gait training;Stair training;Functional mobility training;Therapeutic activities;Therapeutic exercise;Balance training;Patient/family education    PT Goals (Current goals can be found in the Care Plan section)  Acute Rehab PT Goals Patient Stated Goal: To walk without pain and to be able to squat down PT Goal Formulation: With patient Time For Goal Achievement: 02/29/24 Potential to Achieve Goals: Good    Frequency BID     Co-evaluation               AM-PAC PT "6 Clicks" Mobility  Outcome Measure Help needed turning from your back to your side while in a flat bed without using bedrails?: A Little Help needed moving from lying on your back to sitting on the side of a flat bed without using bedrails?: A Little Help needed moving to and from a bed to a chair (including a wheelchair)?: A Little Help needed standing up from a chair using your arms (e.g., wheelchair or bedside chair)?: A Little Help needed to walk in  hospital room?: A Little Help needed climbing 3-5 steps with a railing? : A Lot 6 Click Score: 17    End of Session Equipment Utilized During Treatment: Gait belt Activity Tolerance: Patient tolerated treatment well Patient left: in chair;with call bell/phone within reach;with SCD's reapplied;Other (comment) (polar care donned to L knee) Nurse Communication: Mobility status;Weight bearing status PT Visit Diagnosis: Other abnormalities of gait and mobility (R26.89);Muscle weakness (generalized) (M62.81);Pain Pain - Right/Left: Left Pain - part of body: Knee    Time: 4098-1191 PT Time Calculation (min) (ACUTE ONLY): 55 min   Charges:   PT Evaluation $PT Eval Moderate Complexity: 1 Mod PT Treatments $Gait Training: 8-22  mins $Therapeutic Exercise: 8-22 mins PT General Charges $$ ACUTE PT VISIT: 1 Visit       D. Elly Modena PT, DPT 02/16/24, 5:29 PM

## 2024-02-16 NOTE — TOC Initial Note (Signed)
 Transition of Care [facility]) - Initial/Assessment Note    Patient Details  Name: Cassandra Wang MRN: [id number] Date of Birth: 1966-07-24  Transition of Care [facility]) CM/SW Contact:    Marlowe Sax, RN Phone Number: 02/16/2024, 2:12 PM  Clinical Narrative:                 [facility] was set up prior to admission from surgeons office Patient will have RW delivered to the bedside by adapt        Patient Goals and CMS Choice            Expected Discharge Plan and Services                                              Prior Living Arrangements/Services                       Activities of Daily Living   ADL Screening (condition at time of admission) Independently performs ADLs?: Yes (appropriate for developmental age) Is the patient deaf or have difficulty hearing?: No Does the patient have difficulty seeing, even when wearing glasses/contacts?: No Does the patient have difficulty concentrating, remembering, or making decisions?: No  Permission Sought/Granted                  Emotional Assessment              Admission diagnosis:  History of total knee arthroplasty, left [Z96.652] Patient Active Problem List   Diagnosis Date Noted   History of total knee arthroplasty, left 02/16/2024   Complex cyst of left ovary 02/15/2024   Pelvic pain in female 02/15/2024   Primary osteoarthritis of left knee 02/02/2023   S/P cholecystectomy 12/25/2012   Hepatic hemangioma 12/25/2012   S/P hysterectomy 12/25/2012   Rubella during pregnancy 12/25/2012   H/O mumps 12/25/2012   History of chicken pox 12/25/2012   Absence of both cervix and uterus, acquired 12/25/2012   Other viral diseases complicating pregnancy, unspecified trimester 12/25/2012   Personal history of other infectious and parasitic diseases 12/25/2012   PCP:  Jerl Mina, MD Pharmacy:   [facility] [phone] [address] [address], [facility] - [address] [place]  [address] Phone: [phone] Fax: [phone]     Social Drivers of Health (SDOH) Social History: SDOH Screenings   Food Insecurity: No Food Insecurity (02/16/2024)  Housing: Low Risk  (02/16/2024)  Transportation Needs: No Transportation Needs (02/16/2024)  Utilities: Not At Risk (02/16/2024)  Financial Resource Strain: Patient Declined (03/03/2023)   Received from [facility]  Tobacco Use: Low Risk  (02/16/2024)   SDOH Interventions:     Readmission Risk Interventions     No data to display

## 2024-02-16 NOTE — Discharge Summary (Signed)
 Physician Discharge Summary  Subjective: 1 Day Post-Op Procedure(s) (LRB): ARTHROPLASTY, KNEE, TOTAL, USING IMAGELESS COMPUTER-ASSISTED NAVIGATION (Left) Patient reports pain as mild.   Patient seen in rounds with Dr. Ernest Pine. Patient is well, and has had no acute complaints or problems. Denies any CP, SOB, N/V, fevers or chills. We will start therapy today.  Patient is ready to go home  Physician Discharge Summary  Patient ID: Cassandra Wang MRN: [id number] DOB/AGE: 60-Feb-1967 60 y.o.  Admit date: 02/16/2024 Discharge date: 02/17/2024  Admission Diagnoses:  Discharge Diagnoses:  Principal Problem:   History of total knee arthroplasty, left   Discharged Condition: good  Hospital Course: Patient presented to the hospital on 02/16/2024 for an elective left total knee arthroplasty performed by Dr. Ernest Pine. Patient was given 1g of TXA and 2g of Ancef prior to the procedure. she tolerated the procedure well without any complications. See procedural note below for details. Postoperatively, the patient did very well. she was able to pass PT protocols on post-op day one without any issues. JP drain was removed without any difficulty and was intact. she was able to void her bladder without any difficulty. Physical exam was unremarkable. she denies any SOB, CP, N/V, fevers or chills. Vital signs are stable. Patient is stable to discharge home.  PROCEDURE:  Left total knee arthroplasty using computer-assisted navigation   SURGEON:  Jena Gauss. M.D.   ASSISTANT:  Gean Birchwood, PA-C (present and scrubbed throughout the case, critical for assistance with exposure, retraction, instrumentation, and closure)   ANESTHESIA: spinal   ESTIMATED BLOOD LOSS: 50 mL   FLUIDS REPLACED: 900 mL of crystalloid   TOURNIQUET TIME: 73 minutes   DRAINS: 2 medium Hemovac drains   SOFT TISSUE RELEASES: Anterior cruciate ligament, posterior cruciate ligament, deep medial collateral ligament,  patellofemoral ligament   IMPLANTS UTILIZED: DePuy Attune size 5N posterior stabilized femoral component (cemented), size 4 rotating platform tibial component (cemented), 38 mm medialized dome patella (cemented), and a 5 mm stabilized rotating platform polyethylene insert.  Treatments: None  Discharge Exam: Blood pressure (!) 106/51, pulse 65, temperature 98.1 F (36.7 C), resp. rate 16, height 5\' 4"  (1.626 m), weight 74.4 kg, SpO2 100%.   Disposition: Home   Allergies as of 02/17/2024       Reactions   Chlorhexidine Itching, Rash   Pt used 5 days prior to surgery and the morning of used the wipes in Pre-op with no issues   Augmentin [amoxicillin-pot Clavulanate] Swelling   IgE (PCN) = 13 (WNL) on 02/09/2024 Lip Swlling    Etodolac Swelling   Lips swelling    Sulfa Antibiotics Swelling   Lip swelling    Gluconate Rash   Latex Itching, Rash   IgE <0.10 (WNL) on 02/09/2024        Medication List     TAKE these medications    acetaminophen 500 MG tablet Commonly known as: TYLENOL Take 1,000 mg by mouth every 6 (six) hours as needed for moderate pain (pain score 4-6).   aspirin 81 MG chewable tablet Chew 1 tablet (81 mg total) by mouth 2 (two) times daily.   cetirizine 10 MG tablet Commonly known as: ZYRTEC Take 10 mg by mouth daily as needed for allergies.   Fish Oil Ultra 1400 MG Caps Take 1,400 mg by mouth daily.   fluticasone 50 MCG/ACT nasal spray Commonly known as: FLONASE Place 2 sprays into both nostrils daily as needed for allergies.   meloxicam 15 MG tablet Commonly known as:  MOBIC Take 15 mg by mouth daily as needed for pain.   MULTIVITAMIN WOMENS 50+ ADV PO Take 1 tablet by mouth daily.   OSTEO BI-FLEX JOINT SHIELD PO Take 2 tablets by mouth daily.   oxyCODONE 5 MG immediate release tablet Commonly known as: Oxy IR/ROXICODONE Take 1 tablet (5 mg total) by mouth every 4 (four) hours as needed for moderate pain (pain score 4-6) (pain score 4-6).    Systane Complete 0.6 % Soln Generic drug: Propylene Glycol Place 1 drop into both eyes as needed (dry eyes).   traMADol 50 MG tablet Commonly known as: ULTRAM Take 1-2 tablets (50-100 mg total) by mouth every 4 (four) hours as needed for moderate pain (pain score 4-6).   Vitamin D3 125 MCG (5000 UT) Caps Take 5,000 Units by mouth daily.               Durable Medical Equipment  (From admission, onward)           Start     Ordered   02/16/24 1227  DME Walker rolling  Once       Question:  Patient needs a walker to treat with the following condition  Answer:  Total knee replacement status   02/16/24 1226   02/16/24 1227  DME Bedside commode  Once       Comments: Patient is not able to walk the distance required to go the bathroom, or he/she is unable to safely negotiate stairs required to access the bathroom.  A 3in1 BSC will alleviate this problem  Question:  Patient needs a bedside commode to treat with the following condition  Answer:  Total knee replacement status   02/16/24 1226            Follow-up Information     Rayburn Go, PA-C Follow up on 03/02/2024.   Specialty: Orthopedic Surgery Why: at 10:15am Contact information: [address] [place] [address] [phone]         Donato Heinz, MD Follow up on 03/30/2024.   Specialty: Orthopedic Surgery Why: at 2:45pm Contact information: [address] [facility] [place] [address] [phone]                 Signed: Gean Birchwood 02/17/2024, 8:30 AM   Objective: Vital signs in last 24 hours: Temp:  [97 F (36.1 C)-98.8 F (37.1 C)] 98.1 F (36.7 C) (03/11 0756) Pulse Rate:  [60-74] 65 (03/11 0756) Resp:  [10-18] 16 (03/11 0756) BP: (94-117)/(51-77) 106/51 (03/11 0756) SpO2:  [96 %-100 %] 100 % (03/11 0756)  Intake/Output from previous day:  Intake/Output Summary (Last 24 hours) at 02/17/2024 0830 Last data filed at 02/16/2024 1944 Gross per  24 hour  Intake 1558.09 ml  Output 2820 ml  Net -1261.91 ml    Intake/Output this shift: No intake/output data recorded.  Labs: No results for input(s): "HGB" in the last 72 hours. No results for input(s): "WBC", "RBC", "HCT", "PLT" in the last 72 hours. No results for input(s): "NA", "K", "CL", "CO2", "BUN", "CREATININE", "GLUCOSE", "CALCIUM" in the last 72 hours. No results for input(s): "LABPT", "INR" in the last 72 hours.  EXAM: General - Patient is Alert, Appropriate, and Oriented Extremity - Neurologically intact Neurovascular intact Sensation intact distally Intact pulses distally Dorsiflexion/Plantar flexion intact No cellulitis present Compartment soft Dressing - dressing C/D/I and no drainage Motor Function - intact, moving foot and toes well on exam.  JP Drain pulled without difficulty. Intact  Assessment/Plan: 1 Day Post-Op Procedure(s) (LRB): ARTHROPLASTY, KNEE, TOTAL, USING IMAGELESS COMPUTER-ASSISTED NAVIGATION (Left) Procedure(s) (LRB): ARTHROPLASTY, KNEE, TOTAL, USING IMAGELESS COMPUTER-ASSISTED NAVIGATION (Left) Past Medical History:  Diagnosis Date   Abnormal Pap smear of cervix    Cervical radiculopathy    Dense breast tissue    Diverticulosis 2014   DJD (degenerative joint disease)    GERD (gastroesophageal reflux disease)    Hammer toe of right foot    Heart murmur    Hepatic hemangioma    right lobe   Mitral valve insufficiency and aortic valve insufficiency    mild   Seasonal allergies    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   Principal Problem:   History of total knee arthroplasty, left  Estimated body mass index is 28.15 kg/m as calculated from the following:   Height as of this encounter: 5\' 4"  (1.626 m).   Weight as of this encounter: 74.4 kg.  Patient will continue to work with physical therapy on gait, ROM and strengthening    Discussed with the patient continuing to utilize Polar Care   Patient will use bone foam in 20-30 minute intervals   Patient will wear TED hose bilaterally to help prevent DVT and clot formation   Discussed the Aquacel bandage.  This bandage will stay in place 7 days postoperatively.  Can be replaced with honeycomb bandages that will be sent home with the patient   Discussed sending the patient home with tramadol and oxycodone for as needed pain management.  Patient will also continue with meloxicam to help with swelling and inflammation.  Patient will take an 81 mg aspirin twice daily for DVT prophylaxis   JP drain removed without difficulty, intact   Weight-Bearing as tolerated to left leg   Patient will follow-up with [facility] clinic orthopedics in 2 weeks for staple removal and reevaluation  Diet - Regular diet Follow up - in 2 weeks Activity - WBAT Disposition - Home Condition Upon Discharge - Good DVT Prophylaxis - Aspirin and TED hose  Danise Edge, PA-C Orthopaedic Surgery 02/17/2024, 8:30 AM

## 2024-02-16 NOTE — Anesthesia Procedure Notes (Signed)
 Date/Time: 02/16/2024 7:45 AM  Performed by: Malva Cogan, CRNAPre-anesthesia Checklist: Patient identified, Emergency Drugs available, Suction available, Patient being monitored and Timeout performed Patient Re-evaluated:Patient Re-evaluated prior to induction Oxygen Delivery Method: Simple face mask Induction Type: IV induction Ventilation: Oral airway inserted - appropriate to patient size Placement Confirmation: CO2 detector and positive ETCO2

## 2024-02-16 NOTE — Plan of Care (Signed)
  Problem: Activity: Goal: Risk for activity intolerance will decrease Outcome: Progressing   Problem: Nutrition: Goal: Adequate nutrition will be maintained Outcome: Progressing   Problem: Elimination: Goal: Will not experience complications related to urinary retention Outcome: Progressing   Problem: Pain Managment: Goal: General experience of comfort will improve and/or be controlled Outcome: Progressing   Problem: Safety: Goal: Ability to remain free from injury will improve Outcome: Progressing   Problem: Skin Integrity: Goal: Risk for impaired skin integrity will decrease Outcome: Progressing

## 2024-02-17 ENCOUNTER — Encounter: Payer: Self-pay | Admitting: Orthopedic Surgery

## 2024-02-17 DIAGNOSIS — M1712 Unilateral primary osteoarthritis, left knee: Secondary | ICD-10-CM | POA: Diagnosis not present

## 2024-02-17 MED ORDER — OXYCODONE HCL 5 MG PO TABS: 5.0000 mg | ORAL_TABLET | ORAL | 0 refills | Status: DC | PRN

## 2024-02-17 MED ORDER — ASPIRIN 81 MG PO CHEW: 81.0000 mg | CHEWABLE_TABLET | Freq: Two times a day (BID) | ORAL | Status: DC

## 2024-02-17 MED ORDER — ACETAMINOPHEN 10 MG/ML IV SOLN
INTRAVENOUS | Status: AC
  Filled 2024-02-17: qty 100

## 2024-02-17 MED ORDER — TRAMADOL HCL 50 MG PO TABS: 50.0000 mg | ORAL_TABLET | ORAL | 0 refills | Status: DC | PRN

## 2024-02-17 NOTE — Anesthesia Postprocedure Evaluation (Signed)
 Anesthesia Post Note  Patient: Cassandra Wang  Procedure(s) Performed: ARTHROPLASTY, KNEE, TOTAL, USING IMAGELESS COMPUTER-ASSISTED NAVIGATION (Left: Knee)  Patient location during evaluation: Nursing Unit Anesthesia Type: Combined General/Spinal Level of consciousness: awake and alert and oriented Pain management: pain level controlled Vital Signs Assessment: post-procedure vital signs reviewed and stable Respiratory status: respiratory function stable Cardiovascular status: stable Postop Assessment: no headache, no backache, patient able to bend at knees, no apparent nausea or vomiting, able to ambulate and adequate PO intake Anesthetic complications: no   No notable events documented.   Last Vitals:  Vitals:   02/17/24 0530 02/17/24 0756  BP: (!) 109/58 (!) 106/51  Pulse:  65  Resp:  16  Temp:  36.7 C  SpO2:  100%    Last Pain:  Vitals:   02/17/24 0500  TempSrc:   PainSc: [name]

## 2024-02-17 NOTE — Progress Notes (Signed)
 Occupational Therapy Evaluation Patient Details Name: Cassandra Wang MRN: [id number] DOB: [birth year] Today's Date: 02/17/2024   History of Present Illness   Pt is a 58 yo F diagnosed with degenerative arthrosis of the left knee and is s/p elective L TKA.  PMH includes aortic insufficiency, chest pain, and DJD.     Clinical Impressions Pt seen for OT evaluation this date, POD#1 from above surgery. Pt was independent in all ADLs prior to surgery, however occasionally using walking stick for MOBILITY/ADL due to L knee pain. Pt is eager to return to PLOF with less pain and improved safety and independence. Pt reported having her friend staying with her for a few days on initial discharge. Pt completed sink level ADL with supervision. Amb to toileting with good RW management and safety awareness throughout. Pt currently requires no physical assist for LB dressing while in seated position. Pt retired in Medical illustrator fully dressed in prep for d/c. Pt stated no further questions and feeling confident to return home attending to her ADL/IADLs. Pt instructed in polar care mgt, falls prevention strategies, home/routines modifications, DME/AE for LB bathing and dressing tasks, and compression stocking mgt. Do not currently anticipate any OT needs following this hospitalization. No further OT needs.     If plan is discharge home, recommend the following:   A little help with walking and/or transfers     Functional Status Assessment   Patient has not had a recent decline in their functional status     Equipment Recommendations   BSC/3in1     Recommendations for Other Services         Precautions/Restrictions   Precautions Precautions: Fall Recall of Precautions/Restrictions: Intact Restrictions Weight Bearing Restrictions Per Provider Order: Yes LLE Weight Bearing Per Provider Order: Weight bearing as tolerated     Mobility Bed Mobility Overal bed mobility: Modified Independent                   Transfers Overall transfer level: Needs assistance Equipment used: Rolling walker (2 wheels) Transfers: Sit to/from Stand Sit to Stand: Contact guard assist (On inital STS CGA, digressed to supervision due to pt observed safety awarenss and stability)           General transfer comment: Pt demonstrated safe t/f sequencing with use of RW throughout session.      Balance Overall balance assessment: Needs assistance Sitting-balance support: No upper extremity supported, Feet supported Sitting balance-Leahy Scale: Normal     Standing balance support: Single extremity supported, During functional activity Standing balance-Leahy Scale: Good                             ADL either performed or assessed with clinical judgement   ADL Overall ADL's : Needs assistance/impaired Eating/Feeding: Independent   Grooming: Wash/dry face;Wash/dry hands;Oral care;Brushing hair;Supervision/safety;Standing   Upper Body Bathing: Supervision/ safety;Standing       Upper Body Dressing : Modified independent;Sitting   Lower Body Dressing: Supervision/safety;Sit to/from stand   Toilet Transfer: Supervision/safety;Ambulation;Rolling walker (2 wheels);Comfort height toilet   Toileting- Clothing Manipulation and Hygiene: Modified independent;Sit to/from stand       Functional mobility during ADLs: Rolling walker (2 wheels);Supervision/safety General ADL Comments: Pt completed sink level ADL with supervision. Amb to toileting with good RW management and safety awareness throughout.      Pertinent Vitals/Pain Pain Assessment Pain Assessment: 0-10 Pain Score: 3  Pain Location: L knee Pain Descriptors / Indicators: Sore  Pain Intervention(s): Limited activity within patient's tolerance, Repositioned, Monitored during session (Reported pain only started at end of eval post mobility)     Extremity/Trunk Assessment Upper Extremity Assessment Upper Extremity  Assessment: Overall WFL for tasks assessed   Lower Extremity Assessment Lower Extremity Assessment: LLE deficits/detail LLE Deficits / Details:  (Degenerative arthrosis of the left knee and is s/p elective L TKA.)   Cervical / Trunk Assessment Cervical / Trunk Assessment: Normal   Communication Communication Communication: No apparent difficulties   Cognition Arousal: Alert Behavior During Therapy: WFL for tasks assessed/performed Cognition: No apparent impairments             OT - Cognition Comments: A/Ox4                 Following commands: Intact       Cueing  General Comments   Cueing Techniques: Verbal cues;Visual cues  reviewed stretching, HEP, car transfers, and importance of polar care/positioning   Exercises Exercises: Other exercises Total Joint Exercises Ankle Circles/Pumps: Other (comment) Other Exercises Other Exercises: Edu: role of OT, handout provided and reviewed with pt (compression socks, AD, safe ADL completion)   Shoulder Instructions      Home Living Family/patient expects to be discharged to:: Private residence Living Arrangements: Alone Available Help at Discharge: Friend(s);Available 24 hours/day;Available PRN/intermittently Type of Home: House Home Access: Stairs to enter [employer] of Steps: 1 Entrance Stairs-Rails: None Home Layout: One level     Bathroom Shower/Tub: Producer, television/film/video: Handicapped height     Home Equipment: Cane - single point   Additional Comments: Owns a walking stick.  Will have a friend staying with her 24/7 at discharge for several days and then will have intermittent assist from friends after that.      Prior Functioning/Environment Prior Level of Function : Independent/Modified Independent             Mobility Comments: Ind amb community distances without an AD, owns a walking stick that she rarely uses when she has leg cramps, no fall history ADLs Comments:  Ind with ADLs      OT Goals(Current goals can be found in the care plan section)   Acute Rehab OT Goals Patient Stated Goal: To go home with less pain OT Goal Formulation: With patient Time For Goal Achievement: 03/02/24 Potential to Achieve Goals: Good   AM-PAC OT "6 Clicks" Daily Activity     Outcome Measure Help from another person eating meals?: None Help from another person taking care of personal grooming?: None Help from another person toileting, which includes using toliet, bedpan, or urinal?: None Help from another person bathing (including washing, rinsing, drying)?: None Help from another person to put on and taking off regular upper body clothing?: None Help from another person to put on and taking off regular lower body clothing?: A Little 6 Click Score: 23   End of Session Equipment Utilized During Treatment: Gait belt;Rolling walker (2 wheels) Nurse Communication: Mobility status  Activity Tolerance: Patient tolerated treatment well Patient left: in chair;with call bell/phone within reach                   Time: 0851-0929 OT Time Calculation (min): 38 min Charges:  OT General Charges $OT Visit: 1 Visit OT Evaluation $OT Eval Low Complexity: 1 Low OT Treatments $Self Care/Home Management : 8-22 mins  Glenard Haring M.S. OTR/L  02/17/24, 10:37 AM

## 2024-02-17 NOTE — Progress Notes (Signed)
 Physical Therapy Treatment Patient Details Name: Cassandra Wang MRN: [id number] DOB: 08/02/1966 Today's Date: 02/17/2024   History of Present Illness Pt is a 58 yo F diagnosed with degenerative arthrosis of the left knee and is s/p elective L TKA.  PMH includes aortic insufficiency, chest pain, and DJD.    PT Comments  Pt is A and O x 4. Agreeable to PT session just following OT session. Pt was cooperative and motivated. Tolerated stretching in chair prior to performing ambulation and stair performance. Author reviewed importance of positioning, polar care use, car transfers, and importance of routine mobility. Pt states understanding and is cleared form an acute PT standpoint for safe DC home with HHPT to follow .     If plan is discharge home, recommend the following: A little help with walking and/or transfers;A little help with bathing/dressing/bathroom;Assistance with cooking/housework;Help with stairs or ramp for entrance;Assist for transportation     Equipment Recommendations  BSC/3in1       Precautions / Restrictions Precautions Precautions: Fall Recall of Precautions/Restrictions: Intact Restrictions Weight Bearing Restrictions Per Provider Order: Yes LLE Weight Bearing Per Provider Order: Weight bearing as tolerated     Mobility  Bed Mobility Overal bed mobility: Modified Independent     Transfers Overall transfer level: Modified independent Equipment used: Rolling walker (2 wheels) Transfers: Sit to/from Stand Sit to Stand: Supervision  General transfer comment: Pt was in recliner pre/post session. Vcs for technique imporvements with standing and sitting. reviewed stretching exercises for HEP prior to ambulation/stairs    Ambulation/Gait Ambulation/Gait assistance: Supervision Gait Distance (Feet): 200 Feet Assistive device: Rolling walker (2 wheels) Gait Pattern/deviations: Step-through pattern Gait velocity: decreased  General Gait Details: no LOB or saftey  concerns during ambulation with use of RW   Stairs Stairs: Yes Stairs assistance: Supervision Stair Management: No rails, Backwards, Forwards, With walker Number of Stairs: 1 General stair comments: pt has one step to enter home. she demonstrated safe performance of stairs to simulate home entry.    Balance Overall balance assessment: Needs assistance Sitting-balance support: No upper extremity supported, Feet supported Sitting balance-Leahy Scale: Normal     Standing balance support: Single extremity supported, During functional activity Standing balance-Leahy Scale: Good      Communication Communication Communication: No apparent difficulties  Cognition Arousal: Alert Behavior During Therapy: WFL for tasks assessed/performed   PT - Cognitive impairments: No apparent impairments      Following commands: Intact      Cueing Cueing Techniques: Verbal cues, Visual cues  Exercises Total Joint Exercises Ankle Circles/Pumps: Other (comment) Goniometric ROM: L knee AROM 2-88    General Comments General comments (skin integrity, edema, etc.): reviewed stretching, HEP, car transfers, and importance of polar care/positioning      Pertinent Vitals/Pain Pain Assessment Pain Assessment: 0-10 Pain Score: 4  Pain Location: L knee Pain Descriptors / Indicators: Sore    Home Living Family/patient expects to be discharged to:: Private residence Living Arrangements: Alone Available Help at Discharge: Friend(s);Available 24 hours/day;Available PRN/intermittently Type of Home: House Home Access: Stairs to enter Entrance Stairs-Rails: None Entrance Stairs-Number of Steps: 1   Home Layout: One level Home Equipment: Cane - single point Additional Comments: Owns a walking stick.  Will have a friend staying with her 24/7 at discharge for several days and then will have intermittent assist from friends after that.    Prior Function            PT Goals (current goals can now be  found  in the care plan section) Progress towards PT goals: Progressing toward goals    Frequency    BID       AM-PAC PT "6 Clicks" Mobility   Outcome Measure  Help needed turning from your back to your side while in a flat bed without using bedrails?: A Little Help needed moving from lying on your back to sitting on the side of a flat bed without using bedrails?: A Little Help needed moving to and from a bed to a chair (including a wheelchair)?: A Little Help needed standing up from a chair using your arms (e.g., wheelchair or bedside chair)?: A Little Help needed to walk in hospital room?: A Little Help needed climbing 3-5 steps with a railing? : A Little 6 Click Score: 18    End of Session   Activity Tolerance: Patient tolerated treatment well Patient left: in chair;with call bell/phone within reach;with nursing/sitter in room Nurse Communication: Mobility status;Weight bearing status PT Visit Diagnosis: Other abnormalities of gait and mobility (R26.89);Muscle weakness (generalized) (M62.81);Pain Pain - Right/Left: Left Pain - part of body: Knee     Time: 0931-0950 PT Time Calculation (min) (ACUTE ONLY): 19 min  Charges:    $Therapeutic Activity: 8-22 mins PT General Charges $$ ACUTE PT VISIT: 1 Visit                    Jetta Lout PTA 02/17/24, 10:17 AM

## 2024-02-17 NOTE — TOC Progression Note (Signed)
 Transition of Care [facility]) - Progression Note    Patient Details  Name: Cassandra Wang MRN: [id number] Date of Birth: [birth year]  [facility] of [facility]) CM/SW Contact  Marlowe Sax, RN Phone Number: 02/17/2024, 9:59 AM  Clinical Narrative:   patient to have a 3 in 1 delivered to the bedside         Expected Discharge Plan and Services         Expected Discharge Date: 02/17/24                                     Social Determinants of Health (SDOH) Interventions SDOH Screenings   Food Insecurity: No Food Insecurity (02/16/2024)  Housing: Low Risk  (02/16/2024)  Transportation Needs: No Transportation Needs (02/16/2024)  Utilities: Not At Risk (02/16/2024)  Financial Resource Strain: Patient Declined (03/03/2023)   Received from [facility], [facility]  Tobacco Use: Low Risk  (02/16/2024)    Readmission Risk Interventions     No data to display

## 2024-02-17 NOTE — Plan of Care (Signed)
 Patient continues to progress towards discharge today.

## 2024-02-17 NOTE — Progress Notes (Signed)
 DISCHARGE NOTE:  Pt given discharge instructions, scripts and 2 honeycomb dressings. TED on both legs. BSC and walker sent with pt. Pt wheeled to car by staff, friend providing transportation home.

## 2024-04-12 ENCOUNTER — Other Ambulatory Visit: Payer: Self-pay | Admitting: Family Medicine

## 2024-04-12 DIAGNOSIS — Z1231 Encounter for screening mammogram for malignant neoplasm of breast: Secondary | ICD-10-CM

## 2024-07-08 ENCOUNTER — Ambulatory Visit
Admission: RE | Admit: 2024-07-08 | Discharge: 2024-07-08 | Disposition: A | Discharge status: Home / Self Care | Source: Ambulatory Visit | Attending: Family Medicine | Admitting: Family Medicine

## 2024-07-08 ENCOUNTER — Encounter

## 2024-07-08 DIAGNOSIS — Z1231 Encounter for screening mammogram for malignant neoplasm of breast: Secondary | ICD-10-CM | POA: Insufficient documentation

## 2024-08-20 ENCOUNTER — Encounter: Payer: Self-pay | Admitting: Obstetrics and Gynecology

## 2024-08-20 ENCOUNTER — Inpatient Hospital Stay
Admission: RE | Admit: 2024-08-20 | Discharge: 2024-08-20 | Disposition: A | Discharge status: Home / Self Care | Payer: Self-pay | Source: Ambulatory Visit | Attending: Orthopedic Surgery | Admitting: Orthopedic Surgery

## 2024-08-20 ENCOUNTER — Other Ambulatory Visit: Payer: Self-pay | Admitting: Family Medicine

## 2024-08-20 DIAGNOSIS — Z049 Encounter for examination and observation for unspecified reason: Secondary | ICD-10-CM

## 2024-08-25 ENCOUNTER — Other Ambulatory Visit: Payer: Self-pay | Admitting: Obstetrics and Gynecology

## 2024-08-25 DIAGNOSIS — N6459 Other signs and symptoms in breast: Secondary | ICD-10-CM

## 2024-08-25 DIAGNOSIS — R234 Changes in skin texture: Secondary | ICD-10-CM

## 2024-08-26 ENCOUNTER — Ambulatory Visit
Admission: RE | Admit: 2024-08-26 | Discharge: 2024-08-26 | Disposition: A | Discharge status: Home / Self Care | Source: Ambulatory Visit | Attending: Obstetrics and Gynecology | Admitting: Obstetrics and Gynecology

## 2024-08-26 DIAGNOSIS — N6459 Other signs and symptoms in breast: Secondary | ICD-10-CM | POA: Insufficient documentation

## 2024-08-26 DIAGNOSIS — R234 Changes in skin texture: Secondary | ICD-10-CM

## 2024-08-30 NOTE — Progress Notes (Unsigned)
 Referring Physician:  Valora Agent, MD [address] [place],  [place] [address]  Primary Physician:  Valora Agent, MD  History of Present Illness: 09/01/2024 Ms. Cassandra Wang has a history of has a history diverticulosis, GERD, heart murmur.   She has intermittent low back pain with no leg pain x years. Debe is more constant in the morning- she has a lot of stiffness. Pain improves with heating pad and moving around. Pain also worse with getting up after prolonged sitting. No leg pain. History of recent left TKA with numbness lateral knee and occasional numbness lateral ankle. No other tingling or weakness.   She is not taking anything currently for her back. She's not a big medication person.   Tobacco use: Does not smoke.   Bowel/Bladder Dysfunction: none  Conservative measures:  Physical therapy:  has not participated in for her back Multimodal medical therapy including regular antiinflammatories:  tylenol , oxycodone , prednisone, tramadol   Injections: no epidural steroid injections  Past Surgery: no spinal surgeries  Trella P Jollie has no symptoms of cervical myelopathy.  The symptoms are causing a significant impact on the patient's life.   Review of Systems:  A 10 point review of systems is negative, except for the pertinent positives and negatives detailed in the HPI.  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    Past Surgical  History: Past Surgical History:  Procedure Laterality Date   CESAREAN SECTION  1990   CHOLECYSTECTOMY  12/16/2012   COLONOSCOPY  07/22/2013   COLONOSCOPY WITH PROPOFOL  N/A 10/13/2023   Procedure: COLONOSCOPY WITH PROPOFOL ;  Surgeon: Maryruth Ole DASEN, MD;  Location: [facility] ENDOSCOPY;  Service: Endoscopy;  Laterality: N/A;   KNEE ARTHROPLASTY Left 02/16/2024   Procedure: ARTHROPLASTY, KNEE, TOTAL, USING IMAGELESS COMPUTER-ASSISTED NAVIGATION;  Surgeon: Mardee Agent SQUIBB, MD;  Location: [facility] ORS;  Service: Orthopedics;  Laterality: Left;   VAGINAL HYSTERECTOMY  2005    Allergies: Allergies as of 09/01/2024 - Review Complete 09/01/2024  Allergen Reaction Noted   Chlorhexidine  Itching and Rash 09/19/2015   Sulfites Swelling 02/16/2020   Augmentin [amoxicillin-pot clavulanate] Swelling 10/06/2023   Etodolac Swelling 02/06/2024   Sulfa antibiotics Swelling 02/12/2020   Gluconate Rash 11/16/2015   Latex Itching and Rash 11/16/2015   Silicone Dermatitis 02/16/2014    Medications: Outpatient Encounter Medications as of 09/01/2024  Medication Sig   pantoprazole  (PROTONIX ) 40 MG tablet Take 40 mg by mouth daily.   predniSONE (DELTASONE) 20 MG tablet    acetaminophen  (TYLENOL ) 500 MG tablet Take 1,000 mg by mouth every 6 (six) hours as needed for moderate pain (pain score 4-6).   aspirin  81 MG chewable tablet Chew 1 tablet (81 mg total) by mouth 2 (two) times daily.   cetirizine (ZYRTEC) 10 MG tablet Take 10 mg by mouth daily as needed for allergies.   Cholecalciferol (VITAMIN D3) 125 MCG (5000  UT) CAPS Take 5,000 Units by mouth daily.   fluticasone  (FLONASE ) 50 MCG/ACT nasal spray Place 2 sprays into both nostrils daily as needed for allergies.   meloxicam (MOBIC) 15 MG tablet Take 15 mg by mouth daily as needed for pain.   Misc Natural Products (OSTEO BI-FLEX JOINT SHIELD PO) Take 2 tablets by mouth daily.   Multiple Vitamins-Minerals (MULTIVITAMIN WOMENS 50+ ADV PO) Take 1 tablet by mouth  daily.   Omega-3 Fatty Acids (FISH OIL ULTRA) 1400 MG CAPS Take 1,400 mg by mouth daily.   oxyCODONE  (OXY IR/ROXICODONE ) 5 MG immediate release tablet Take 1 tablet (5 mg total) by mouth every 4 (four) hours as needed for moderate pain (pain score 4-6) (pain score 4-6).   Propylene Glycol (SYSTANE COMPLETE) 0.6 % SOLN Place 1 drop into both eyes as needed (dry eyes). (Patient not taking: Reported on 02/16/2024)   traMADol  (ULTRAM ) 50 MG tablet Take 1-2 tablets (50-100 mg total) by mouth every 4 (four) hours as needed for moderate pain (pain score 4-6).   No facility-administered encounter medications on file as of 09/01/2024.    Social History: Social History   Tobacco Use   Smoking status: Never   Smokeless tobacco: Never  Vaping Use   Vaping status: Never Used  Substance Use Topics   Alcohol  use: Yes    Comment: rarely   Drug use: No    Family Medical History: Family History  Problem Relation Age of Onset   Breast cancer Paternal Aunt 32    Physical Examination: Vitals:   09/01/24 1313  BP: 110/62    General: Patient is well developed, well nourished, calm, collected, and in no apparent distress. Attention to examination is appropriate.  Respiratory: Patient is breathing without any difficulty.   NEUROLOGICAL:     Awake, alert, oriented to person, place, and time.  Speech is clear and fluent. Fund of knowledge is appropriate.   Cranial Nerves: Pupils equal round and reactive to light.  Facial tone is symmetric.    Mild central lower posterior lumbar tenderness.   No abnormal lesions on exposed skin.   Strength: Side Biceps Triceps Deltoid Interossei Grip Wrist Ext. Wrist Flex.  R 5 5 5 5 5 5 5   L 5 5 5 5 5 5 5    Side Iliopsoas Quads Hamstring PF DF EHL  R 5 5 5 5 5 5   L 5 5 5 5 5 5    Reflexes are 2+ and symmetric at the biceps, brachioradialis, patella and achilles.   Hoffman's is absent.  Clonus is not present.   Bilateral upper and lower extremity  sensation is intact to light touch.     No pain with IR/ER of both hips.   Gait is normal.    Medical Decision Making  Imaging: Lumbar xrays dated 12/19/23:  Mild spondylosis with mild DDD L5-S1.   Report not available for above xrays.   Assessment and Plan: Ms. Gathers has intermittent low back pain with no leg pain x years. Pain is more constant in the morning- she has a lot of stiffness. Pain improves with heating pad and moving around. Pain also worse with getting up after prolonged sitting.   She has known mild lumbar spondylosis mild DDD L5-S1. Pain likely due to underlying spondylosis and DDD.    Treatment options discussed with patient and following plan made:   - PT for lumbar spine. Orders to [facility]. She will call to schedule.  - She would like to hold on  medications. Had elevated LFTs a few months ago with most recent labs looking good. She will discuss bioflex with PCP prior to taking.  - If no improvement with above, consider lumbar MRI and possible injections.  - She will follow up with me in 6-8 weeks and prn.   I spent a total of 35 minutes in face-to-face and non-face-to-face activities related to this patient's care today including review of outside records, review of imaging, review of symptoms, physical exam, discussion of differential diagnosis, discussion of treatment options, and documentation.   Thank you for involving me in the care of this patient.   Glade Boys PA-C Dept. of Neurosurgery

## 2024-09-01 ENCOUNTER — Encounter: Payer: Self-pay | Admitting: Orthopedic Surgery

## 2024-09-01 ENCOUNTER — Ambulatory Visit (INDEPENDENT_AMBULATORY_CARE_PROVIDER_SITE_OTHER): Admitting: Orthopedic Surgery

## 2024-09-01 VITALS — BP 110/62 | Ht 64.0 in | Wt 165.0 lb

## 2024-09-01 DIAGNOSIS — M5137 Other intervertebral disc degeneration, lumbosacral region with discogenic back pain only: Secondary | ICD-10-CM | POA: Diagnosis not present

## 2024-09-01 DIAGNOSIS — M47816 Spondylosis without myelopathy or radiculopathy, lumbar region: Secondary | ICD-10-CM | POA: Diagnosis not present

## 2024-09-01 DIAGNOSIS — M5136 Other intervertebral disc degeneration, lumbar region with discogenic back pain only: Secondary | ICD-10-CM

## 2024-09-01 DIAGNOSIS — M545 Low back pain, unspecified: Secondary | ICD-10-CM

## 2024-09-01 NOTE — Patient Instructions (Signed)
 It was so nice to see you today. Thank you so much for coming in.    You have some mild wear and tear in your back with degeneration of the disc at L5-S1. This is likely causing your back pain.   I sent physical therapy orders to [facility]. You need to call them at [phone] to schedule your visit.   I would discuss taking bioflex with your PCP or pharmacist due to history of elevated liver labs.   If no improvement with PT, we can consider a lumbar MRI and possible injections.   I will see you back in 6-8 weeks. Please do not hesitate to call if you have any questions or concerns. You can also message me in MyChart.   Glade Boys PA-C [phone]     The physicians and staff at [facility] are committed to providing excellent care. You may receive a survey asking for feedback about your experience at our office. We value you your feedback and appreciate you taking the time to to fill it out. The [facility] leadership team is also available to discuss your experience in person, feel free to contact us  [phone].

## 2024-09-06 NOTE — Therapy (Signed)
 OUTPATIENT PHYSICAL THERAPY THORACOLUMBAR EVALUATION   Patient Name: Cassandra Wang MRN: [id number] DOB:July 04, 1966, 58 y.o., female Today's Date: 09/07/2024  END OF SESSION:  PT End of Session - 09/07/24 1552     Visit Number 1    Number of Visits 17    Date for Recertification  11/02/24    PT Start Time 1554    PT Stop Time 1635    PT Time Calculation (min) 41 min    Activity Tolerance Patient tolerated treatment well    Behavior During Therapy [facility] for tasks assessed/performed          Past Medical History:  Diagnosis Date   Abnormal Pap smear of cervix    Cervical radiculopathy    Dense breast tissue    Diverticulosis 2014   DJD (degenerative joint disease)    GERD (gastroesophageal reflux disease)    Hammer toe of right foot    Heart murmur    Hepatic hemangioma    right lobe   Mitral valve insufficiency and aortic valve insufficiency    mild   Seasonal allergies    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   Past Surgical History:  Procedure Laterality Date   CESAREAN SECTION  1990   CHOLECYSTECTOMY  12/16/2012   COLONOSCOPY  07/22/2013   COLONOSCOPY WITH PROPOFOL  N/A 10/13/2023   Procedure: COLONOSCOPY WITH PROPOFOL ;  Surgeon: Maryruth Ole DASEN, MD;  Location: [facility] ENDOSCOPY;  Service: Endoscopy;  Laterality: N/A;   KNEE ARTHROPLASTY Left 02/16/2024   Procedure: ARTHROPLASTY, KNEE, TOTAL, USING IMAGELESS COMPUTER-ASSISTED NAVIGATION;  Surgeon: Mardee Lynwood SQUIBB, MD;  Location: [facility] ORS;  Service: Orthopedics;  Laterality: Left;   VAGINAL HYSTERECTOMY  2005   Patient Active Problem List   Diagnosis Date Noted   History of total knee arthroplasty, left 02/16/2024   Complex cyst of left ovary 02/15/2024   Pelvic pain in female 02/15/2024   Primary osteoarthritis of left knee 02/02/2023   S/P cholecystectomy 12/25/2012   Hepatic hemangioma 12/25/2012   S/P  hysterectomy 12/25/2012   Rubella during pregnancy 12/25/2012   H/O mumps 12/25/2012   History of chicken pox 12/25/2012   Absence of both cervix and uterus, acquired 12/25/2012   Other viral diseases complicating pregnancy, unspecified trimester 12/25/2012   Personal history of other infectious and parasitic diseases 12/25/2012    PCP: Valora Lynwood, MD  REFERRING PROVIDER: Hilma Hastings, PA-C   REFERRING DIAG:  [phone] (ICD-10-CM) - Chronic bilateral low back pain without sciatica M47.816 (ICD-10-CM) - Lumbar spondylosis M51.360 (ICD-10-CM) - Degeneration of intervertebral disc of lumbar region with discogenic back pain  Rationale for Evaluation and Treatment: Rehabilitation  THERAPY DIAG:  Muscle weakness (generalized)  Bilateral low back pain, unspecified chronicity, unspecified whether sciatica present  ONSET DATE: chronic  SUBJECTIVE:  SUBJECTIVE STATEMENT: Patient reports her back pain has been going on for years but comes and goes. She notices it every morning and feeling stiff. She uses heating pad and moves around, it tends to feel better. She has an adjustable mattress and finds that the pain is not as bad with her head slightly elevated.   PERTINENT HISTORY:  Per PA note on 09/01/24, She has intermittent low back pain with no leg pain x years. Pain is more constant in the morning- she has a lot of stiffness. Pain improves with heating pad and moving around. Pain also worse with getting up after prolonged sitting. No leg pain. History of recent left TKA on 02/16/24 with numbness lateral knee and occasional numbness lateral ankle.  PAIN:  Are you having pain? Yes: NPRS scale: 0/10; 10/10 worst  Pain location: low back  Pain description: stiffness, achy Aggravating factors:  sleeping, getting up first thing in the morning  Relieving factors: movement, heat  PRECAUTIONS: None  RED FLAGS: None   WEIGHT BEARING RESTRICTIONS: No  FALLS:  Has patient fallen in last 6 months? No  LIVING ENVIRONMENT: Lives with: lives alone Lives in: House/apartment  OCCUPATION: partially retired   PLOF: Independent  PATIENT GOALS: for pain to go away   OBJECTIVE:  Note: Objective measures were completed at Evaluation unless otherwise noted.  DIAGNOSTIC FINDINGS:  N/A  PATIENT SURVEYS:  Modified Oswestry:  MODIFIED OSWESTRY DISABILITY SCALE  Date: 09/07/24 Score  Pain intensity 0 = I can tolerate the pain I have without having to use pain medication.  2. Personal care (washing, dressing, etc.) 1 =  I can take care of myself normally, but it increases my pain.  3. Lifting 4 = I can lift only very light weights  4. Walking 1 = Pain prevents me from walking more than 1 mile.  5. Sitting 2 =  Pain prevents me from sitting more than 1 hour.  6. Standing 2 =  Pain prevents me from standing more than 1 hour  7. Sleeping 0 = Pain does not prevent me from sleeping well.  8. Social Life 1 =  My social life is normal, but it increases my level of pain.  9. Traveling 1 =  I can travel anywhere, but it increases my pain.  10. Employment/ Homemaking 1 = My normal homemaking/job activities increase my pain, but I can still perform all that is required of me  Total 13/50 = 26%   Interpretation of scores: Score Category Description  0-20% Minimal Disability The patient can cope with most living activities. Usually no treatment is indicated apart from advice on lifting, sitting and exercise  21-40% Moderate Disability The patient experiences more pain and difficulty with sitting, lifting and standing. Travel and social life are more difficult and they may be disabled from work. Personal care, sexual activity and sleeping are not grossly affected, and the patient can usually be  managed by conservative means  41-60% Severe Disability Pain remains the main problem in this group, but activities of daily living are affected. These patients require a detailed investigation  61-80% Crippled Back pain impinges on all aspects of the patient's life. Positive intervention is required  81-100% Bed-bound  These patients are either bed-bound or exaggerating their symptoms  Bluford FORBES [name] DELENA Karon DELENA, et al. Surgery versus conservative management of stable thoracolumbar fracture: the PRESTO feasibility RCT. [place] ([place]): [employer]; 2021 Nov. [facility] Technology Assessment, No. 25.62.) Appendix 3, Oswestry Disability Index category  descriptors. Available from: FindJewel[employer].cz  Minimally Clinically Important Difference (MCID) = 12.8%  COGNITION: Overall cognitive status: Within functional limits for tasks assessed     SENSATION: WFL  MUSCLE LENGTH: Hamstrings: WFL   POSTURE: No Significant postural limitations  PALPATION: Tightness noted to lumbar paraspinals, QL, and B piriformis - slight tenderness to lumbar paraspinals   LUMBAR ROM:   AROM eval  Flexion WFL  Extension WFL  Right lateral flexion WFL  Left lateral flexion WFL  Right rotation WFL  Left rotation WFL   (Blank rows = not tested)  LOWER EXTREMITY ROM:     WFL   LOWER EXTREMITY MMT:    MMT Right eval Left eval  Hip flexion 4- 4  Hip extension    Hip abduction 4 4  Hip adduction 4+ 4+  Hip internal rotation    Hip external rotation    Knee flexion 4+ 4+  Knee extension 4+ 4+  Ankle dorsiflexion 4+ 4+  Ankle plantarflexion    Ankle inversion    Ankle eversion     (Blank rows = not tested)  LUMBAR SPECIAL TESTS:  Straight leg raise test: Negative and FABER test: Negative  FUNCTIONAL TESTS:  5 times sit to stand: 11.75 seconds 6 minute walk test: to be completed visit #2   GAIT: Distance walked: 47' Assistive device utilized:  None Level of assistance: Complete Independence Comments: no significant gait deviations   TREATMENT DATE: 09/07/24                                                                                                                               PATIENT EDUCATION:  Education details: HEP, POC, goals Person educated: Patient Education method: Explanation, Demonstration, and Handouts Education comprehension: verbalized understanding and returned demonstration  HOME EXERCISE PROGRAM: Access Code: [email or web address] URL: [email or web address] Date: 09/07/2024 Prepared by: Maryanne Finder  Exercises - Supine Lower Trunk Rotation  - 2 x daily - 5-7 x weekly - 10 reps - 3-5 second hold - Sidelying Thoracic Rotation with Open Book  - 2 x daily - 5-7 x weekly - 10 reps - Seated Piriformis Stretch  - 2-3 x daily - 5-7 x weekly - 3-5 reps - 30-60 seconds hold - Seated Hamstring Stretch  - 2-3 x daily - 5-7 x weekly - 3-5 reps - 30-60 seconds hold  ASSESSMENT:  CLINICAL IMPRESSION: Patient is a 58 y.o. female who was seen today for physical therapy evaluation and treatment for low back pain. Patient with significant tightness noted to lumbar paraspinals and piriformis. Demonstrates B hip weakness and decreased activity tolerance. HEP initiated with lower trunk/thoracic stretching along with piriformis and hamstring stretching. Patient will benefit from skilled PT to address listed impairments to improve quality of life and reduce back pain.   OBJECTIVE IMPAIRMENTS: Abnormal gait, decreased activity tolerance, difficulty walking, decreased strength, postural dysfunction, and pain.   ACTIVITY LIMITATIONS: lifting and sleeping  PARTICIPATION LIMITATIONS: cleaning, laundry, and community  activity  PERSONAL FACTORS: Age, Past/current experiences, and Time since onset of injury/illness/exacerbation are also affecting patient's functional outcome.   REHAB POTENTIAL: Good  CLINICAL DECISION MAKING:  Stable/uncomplicated  EVALUATION COMPLEXITY: Low   GOALS: Goals reviewed with patient? Yes  SHORT TERM GOALS: Target date: 09/28/2024  Patient will be independent in HEP to improve strength/mobility for better functional independence with ADLs.  Baseline: 09/07/24: HEP initiated  Goal status: INITIAL  LONG TERM GOALS: Target date: 10/19/2024  Patient will reduce modified Oswestry score to <20 as to demonstrate minimal disability with ADLs including improved sleeping tolerance, walking/sitting tolerance etc for better mobility with ADLs.   Baseline: 09/07/24: 13/50 = 26% Goal status: INITIAL  2.  Patient will report a worst pain of 3/10 on NRPS in low back to improve tolerance with ADLs and reduced symptoms with activities. Baseline: 09/07/24: 10/10  Goal status: INITIAL  3.  Patient will improve 5xSTS by 3 seconds to demonstrate improvement in functional BLE strength.  Baseline: 09/07/24: 11.75 seconds  Goal status: INITIAL  4.  Patient will improve by 77m (164') in order to demonstrate clinically significant improvement in cardiopulmonary endurance and community ambulation  Baseline: 09/07/24: to be completed visit #2 Goal status: INITIAL   PLAN:  PT FREQUENCY: 1-2x/week  PT DURATION: 6 weeks  PLANNED INTERVENTIONS: 97164- PT Re-evaluation, 97750- Physical Performance Testing, 97110-Therapeutic exercises, 97530- Therapeutic activity, [id number]- Neuromuscular re-education, 97535- Self Care, [id number]- Manual therapy, [id number]- Gait training, [phone]- Electrical stimulation (unattended), [id number]- Electrical stimulation (manual), 20560 (1-2 muscles), 20561 (3+ muscles)- Dry Needling, Patient/Family education, Stair training, Spinal manipulation, Spinal mobilization, Cryotherapy, and Moist heat.  PLAN FOR NEXT SESSION: HEP review, stretching, manual STM to lumbar paraspinals, B hip strengthening   Maryanne Finder, PT, DPT Physical Therapist - [place]  [facility] 09/07/2024, 4:51 PM

## 2024-09-07 ENCOUNTER — Ambulatory Visit: Attending: Orthopedic Surgery

## 2024-09-07 DIAGNOSIS — M545 Low back pain, unspecified: Secondary | ICD-10-CM | POA: Insufficient documentation

## 2024-09-07 DIAGNOSIS — M5136 Other intervertebral disc degeneration, lumbar region with discogenic back pain only: Secondary | ICD-10-CM | POA: Diagnosis not present

## 2024-09-07 DIAGNOSIS — G8929 Other chronic pain: Secondary | ICD-10-CM | POA: Diagnosis not present

## 2024-09-07 DIAGNOSIS — M47816 Spondylosis without myelopathy or radiculopathy, lumbar region: Secondary | ICD-10-CM | POA: Insufficient documentation

## 2024-09-07 DIAGNOSIS — M6281 Muscle weakness (generalized): Secondary | ICD-10-CM | POA: Diagnosis present

## 2024-09-09 ENCOUNTER — Encounter

## 2024-09-09 NOTE — Therapy (Signed)
 OUTPATIENT PHYSICAL THERAPY THORACOLUMBAR TREATMENT   Patient Name: Cassandra Wang MRN: [id number] DOB:05-22-1966, 58 y.o., female Today's Date: 09/13/2024  END OF SESSION:  PT End of Session - 09/13/24 1549     Visit Number 2    Number of Visits 17    Date for Recertification  11/02/24    PT Start Time 1600    PT Stop Time 1642    PT Time Calculation (min) 42 min    Activity Tolerance Patient tolerated treatment well    Behavior During Therapy [facility] for tasks assessed/performed           Past Medical History:  Diagnosis Date   Abnormal Pap smear of cervix    Cervical radiculopathy    Dense breast tissue    Diverticulosis 2014   DJD (degenerative joint disease)    GERD (gastroesophageal reflux disease)    Hammer toe of right foot    Heart murmur    Hepatic hemangioma    right lobe   Mitral valve insufficiency and aortic valve insufficiency    mild   Seasonal allergies    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   Past Surgical History:  Procedure Laterality Date   CESAREAN SECTION  1990   CHOLECYSTECTOMY  12/16/2012   COLONOSCOPY  07/22/2013   COLONOSCOPY WITH PROPOFOL  N/A 10/13/2023   Procedure: COLONOSCOPY WITH PROPOFOL ;  Surgeon: Maryruth Ole DASEN, MD;  Location: [facility] ENDOSCOPY;  Service: Endoscopy;  Laterality: N/A;   KNEE ARTHROPLASTY Left 02/16/2024   Procedure: ARTHROPLASTY, KNEE, TOTAL, USING IMAGELESS COMPUTER-ASSISTED NAVIGATION;  Surgeon: Mardee Lynwood SQUIBB, MD;  Location: [facility] ORS;  Service: Orthopedics;  Laterality: Left;   VAGINAL HYSTERECTOMY  2005   Patient Active Problem List   Diagnosis Date Noted   History of total knee arthroplasty, left 02/16/2024   Complex cyst of left ovary 02/15/2024   Pelvic pain in female 02/15/2024   Primary osteoarthritis of left knee 02/02/2023   S/P cholecystectomy 12/25/2012   Hepatic hemangioma 12/25/2012   S/P  hysterectomy 12/25/2012   Rubella during pregnancy 12/25/2012   H/O mumps 12/25/2012   History of chicken pox 12/25/2012   Absence of both cervix and uterus, acquired 12/25/2012   Other viral diseases complicating pregnancy, unspecified trimester 12/25/2012   Personal history of other infectious and parasitic diseases 12/25/2012    PCP: Valora Lynwood, MD  REFERRING PROVIDER: Hilma Hastings, PA-C   REFERRING DIAG:  [phone] (ICD-10-CM) - Chronic bilateral low back pain without sciatica M47.816 (ICD-10-CM) - Lumbar spondylosis M51.360 (ICD-10-CM) - Degeneration of intervertebral disc of lumbar region with discogenic back pain  Rationale for Evaluation and Treatment: Rehabilitation  THERAPY DIAG:  Muscle weakness (generalized)  Bilateral low back pain, unspecified chronicity, unspecified whether sciatica present  ONSET DATE: chronic  SUBJECTIVE:  SUBJECTIVE STATEMENT: Patient reports her back pain has been going on for years but comes and goes. She notices it every morning and feeling stiff. She uses heating pad and moves around, it tends to feel better. She has an adjustable mattress and finds that the pain is not as bad with her head slightly elevated.   PERTINENT HISTORY:  Per PA note on 09/01/24, She has intermittent low back pain with no leg pain x years. Pain is more constant in the morning- she has a lot of stiffness. Pain improves with heating pad and moving around. Pain also worse with getting up after prolonged sitting. No leg pain. History of recent left TKA on 02/16/24 with numbness lateral knee and occasional numbness lateral ankle.  PAIN:  Are you having pain? Yes: NPRS scale: 0/10; 10/10 worst  Pain location: low back  Pain description: stiffness, achy Aggravating factors:  sleeping, getting up first thing in the morning  Relieving factors: movement, heat  PRECAUTIONS: None  RED FLAGS: None   WEIGHT BEARING RESTRICTIONS: No  FALLS:  Has patient fallen in last 6 months? No  LIVING ENVIRONMENT: Lives with: lives alone Lives in: House/apartment  OCCUPATION: partially retired   PLOF: Independent  PATIENT GOALS: for pain to go away   OBJECTIVE:  Note: Objective measures were completed at Evaluation unless otherwise noted.  DIAGNOSTIC FINDINGS:  N/A  PATIENT SURVEYS:  Modified Oswestry:  MODIFIED OSWESTRY DISABILITY SCALE  Date: 09/07/24 Score  Pain intensity 0 = I can tolerate the pain I have without having to use pain medication.  2. Personal care (washing, dressing, etc.) 1 =  I can take care of myself normally, but it increases my pain.  3. Lifting 4 = I can lift only very light weights  4. Walking 1 = Pain prevents me from walking more than 1 mile.  5. Sitting 2 =  Pain prevents me from sitting more than 1 hour.  6. Standing 2 =  Pain prevents me from standing more than 1 hour  7. Sleeping 0 = Pain does not prevent me from sleeping well.  8. Social Life 1 =  My social life is normal, but it increases my level of pain.  9. Traveling 1 =  I can travel anywhere, but it increases my pain.  10. Employment/ Homemaking 1 = My normal homemaking/job activities increase my pain, but I can still perform all that is required of me  Total 13/50 = 26%   Interpretation of scores: Score Category Description  0-20% Minimal Disability The patient can cope with most living activities. Usually no treatment is indicated apart from advice on lifting, sitting and exercise  21-40% Moderate Disability The patient experiences more pain and difficulty with sitting, lifting and standing. Travel and social life are more difficult and they may be disabled from work. Personal care, sexual activity and sleeping are not grossly affected, and the patient can usually be  managed by conservative means  41-60% Severe Disability Pain remains the main problem in this group, but activities of daily living are affected. These patients require a detailed investigation  61-80% Crippled Back pain impinges on all aspects of the patient's life. Positive intervention is required  81-100% Bed-bound  These patients are either bed-bound or exaggerating their symptoms  Bluford FORBES [name] DELENA Karon DELENA, et al. Surgery versus conservative management of stable thoracolumbar fracture: the PRESTO feasibility RCT. [place] [employer]; 2021 Nov. [facility] Technology Assessment, No. 25.62.) Appendix 3, Oswestry Disability Index category  descriptors. Available from: FindJewelers.cz  Minimally Clinically Important Difference (MCID) = 12.8%  COGNITION: Overall cognitive status: Within functional limits for tasks assessed     SENSATION: WFL  MUSCLE LENGTH: Hamstrings: WFL   POSTURE: No Significant postural limitations  PALPATION: Tightness noted to lumbar paraspinals, QL, and B piriformis - slight tenderness to lumbar paraspinals   LUMBAR ROM:   AROM eval  Flexion WFL  Extension WFL  Right lateral flexion WFL  Left lateral flexion WFL  Right rotation WFL  Left rotation WFL   (Blank rows = not tested)  LOWER EXTREMITY ROM:     WFL   LOWER EXTREMITY MMT:    MMT Right eval Left eval  Hip flexion 4- 4  Hip extension    Hip abduction 4 4  Hip adduction 4+ 4+  Hip internal rotation    Hip external rotation    Knee flexion 4+ 4+  Knee extension 4+ 4+  Ankle dorsiflexion 4+ 4+  Ankle plantarflexion    Ankle inversion    Ankle eversion     (Blank rows = not tested)  LUMBAR SPECIAL TESTS:  Straight leg raise test: Negative and FABER test: Negative  FUNCTIONAL TESTS:  5 times sit to stand: 11.75 seconds 6 minute walk test: to be completed visit #2   GAIT: Distance walked: 36' Assistive device utilized:  None Level of assistance: Complete Independence Comments: no significant gait deviations   TREATMENT DATE: 09/13/24                                                                                                                                Subjective: Patient reports continued pain when sitting for too long. First thing in the morning has gotten better. Reports 2-3/10 pain currently in low back.   : 1368' with no AD   Manual Therapy:  STM to B lumbar/thoracic paraspinals, piriformis, QL x 10 minutes Therapist assisted stretching for SKTC and piriformis 3 x 30 seconds each LE with each stretch  Therapeutic Exercise:   Lower trunk rotation x 10 each direction   Sidelying open book stretch x 10 each side   Supine bridge 2 x 10   Supine marching with RTB around knees 2 x 10 each LE - cues for core activation   Hooklying TrA activation with green physioball press 2 x 10 with 2-3 second hold   Hooklying TrA activation with green physioball press + alternating UE overhead lift x 10    PATIENT EDUCATION:  Education details: HEP, POC, goals Person educated: Patient Education method: Explanation, Demonstration, and Handouts Education comprehension: verbalized understanding and returned demonstration  HOME EXERCISE PROGRAM: Access Code: [email or web address] URL: [email or web address] Date: 09/07/2024 Prepared by: Maryanne Finder  Exercises - Supine Lower Trunk Rotation  - 2 x daily - 5-7 x weekly - 10 reps - 3-5 second hold - Sidelying Thoracic Rotation with Open Book  - 2 x daily - 5-7 x weekly - 10  reps - Seated Piriformis Stretch  - 2-3 x daily - 5-7 x weekly - 3-5 reps - 30-60 seconds hold - Seated Hamstring Stretch  - 2-3 x daily - 5-7 x weekly - 3-5 reps - 30-60 seconds hold  ASSESSMENT:  CLINICAL IMPRESSION:   Continued PT POC regarding low back pain/stiffness. Session focused on manual STM/stretching along with core activation training with good demonstration.  Tolerated session well with reported decrease in pain at end of session. Patient will benefit from skilled PT to address listed impairments to improve quality of life and reduce back pain.   OBJECTIVE IMPAIRMENTS: Abnormal gait, decreased activity tolerance, difficulty walking, decreased strength, postural dysfunction, and pain.   ACTIVITY LIMITATIONS: lifting and sleeping  PARTICIPATION LIMITATIONS: cleaning, laundry, and community activity  PERSONAL FACTORS: Age, Past/current experiences, and Time since onset of injury/illness/exacerbation are also affecting patient's functional outcome.   REHAB POTENTIAL: Good  CLINICAL DECISION MAKING: Stable/uncomplicated  EVALUATION COMPLEXITY: Low   GOALS: Goals reviewed with patient? Yes  SHORT TERM GOALS: Target date: 09/28/2024  Patient will be independent in HEP to improve strength/mobility for better functional independence with ADLs.  Baseline: 09/07/24: HEP initiated  Goal status: INITIAL  LONG TERM GOALS: Target date: 10/19/2024  Patient will reduce modified Oswestry score to <20 as to demonstrate minimal disability with ADLs including improved sleeping tolerance, walking/sitting tolerance etc for better mobility with ADLs.   Baseline: 09/07/24: 13/50 = 26% Goal status: INITIAL  2.  Patient will report a worst pain of 3/10 on NRPS in low back to improve tolerance with ADLs and reduced symptoms with activities. Baseline: 09/07/24: 10/10  Goal status: INITIAL  3.  Patient will improve 5xSTS by 3 seconds to demonstrate improvement in functional BLE strength.  Baseline: 09/07/24: 11.75 seconds  Goal status: INITIAL  4.  Patient will improve by 70m (164') in order to demonstrate clinically significant improvement in cardiopulmonary endurance and community ambulation  Baseline: 09/07/24: to be completed visit #2 Goal status: INITIAL   PLAN:  PT FREQUENCY: 1-2x/week  PT DURATION: 6 weeks  PLANNED INTERVENTIONS: 97164- PT  Re-evaluation, 97750- Physical Performance Testing, 97110-Therapeutic exercises, 97530- Therapeutic activity, [id number]- Neuromuscular re-education, 97535- Self Care, 02859- Manual therapy, [id number]- Gait training, [phone]- Electrical stimulation (unattended), [id number]- Electrical stimulation (manual), 20560 (1-2 muscles), 20561 (3+ muscles)- Dry Needling, Patient/Family education, Stair training, Spinal manipulation, Spinal mobilization, Cryotherapy, and Moist heat.  PLAN FOR NEXT SESSION: HEP review, stretching, manual STM to lumbar paraspinals, B hip strengthening   Maryanne Finder, PT, DPT Physical Therapist - [place]  [facility] 09/13/2024, 3:50 PM

## 2024-09-13 ENCOUNTER — Ambulatory Visit: Attending: Orthopedic Surgery

## 2024-09-13 DIAGNOSIS — M6281 Muscle weakness (generalized): Secondary | ICD-10-CM | POA: Diagnosis present

## 2024-09-13 DIAGNOSIS — M545 Low back pain, unspecified: Secondary | ICD-10-CM | POA: Diagnosis present

## 2024-09-15 NOTE — Therapy (Signed)
 OUTPATIENT PHYSICAL THERAPY THORACOLUMBAR TREATMENT   Patient Name: Cassandra Wang MRN: [id number] DOB:Aug 01, 1966, 58 y.o., female Today's Date: 09/16/2024  END OF SESSION:  PT End of Session - 09/16/24 1515     Visit Number 3    Number of Visits 17    Date for Recertification  11/02/24    PT Start Time 1515    PT Stop Time 1556    PT Time Calculation (min) 41 min    Activity Tolerance Patient tolerated treatment well    Behavior During Therapy [facility] for tasks assessed/performed            Past Medical History:  Diagnosis Date   Abnormal Pap smear of cervix    Cervical radiculopathy    Dense breast tissue    Diverticulosis 2014   DJD (degenerative joint disease)    GERD (gastroesophageal reflux disease)    Hammer toe of right foot    Heart murmur    Hepatic hemangioma    right lobe   Mitral valve insufficiency and aortic valve insufficiency    mild   Seasonal allergies    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   Past Surgical History:  Procedure Laterality Date   CESAREAN SECTION  1990   CHOLECYSTECTOMY  12/16/2012   COLONOSCOPY  07/22/2013   COLONOSCOPY WITH PROPOFOL  N/A 10/13/2023   Procedure: COLONOSCOPY WITH PROPOFOL ;  Surgeon: Cassandra Ole DASEN, MD;  Location: [facility] ENDOSCOPY;  Service: Endoscopy;  Laterality: N/A;   KNEE ARTHROPLASTY Left 02/16/2024   Procedure: ARTHROPLASTY, KNEE, TOTAL, USING IMAGELESS COMPUTER-ASSISTED NAVIGATION;  Surgeon: Cassandra Wang SQUIBB, MD;  Location: [facility] ORS;  Service: Orthopedics;  Laterality: Left;   VAGINAL HYSTERECTOMY  2005   Patient Active Problem List   Diagnosis Date Noted   History of total knee arthroplasty, left 02/16/2024   Complex cyst of left ovary 02/15/2024   Pelvic pain in female 02/15/2024   Primary osteoarthritis of left knee 02/02/2023   S/P cholecystectomy 12/25/2012   Hepatic hemangioma 12/25/2012   S/P  hysterectomy 12/25/2012   Rubella during pregnancy 12/25/2012   H/O mumps 12/25/2012   History of chicken pox 12/25/2012   Absence of both cervix and uterus, acquired 12/25/2012   Other viral diseases complicating pregnancy, unspecified trimester 12/25/2012   Personal history of other infectious and parasitic diseases 12/25/2012    PCP: Cassandra Lynwood, MD  REFERRING PROVIDER: Hilma Hastings, PA-C   REFERRING DIAG:  [phone] (ICD-10-CM) - Chronic bilateral low back pain without sciatica M47.816 (ICD-10-CM) - Lumbar spondylosis M51.360 (ICD-10-CM) - Degeneration of intervertebral disc of lumbar region with discogenic back pain  Rationale for Evaluation and Treatment: Rehabilitation  THERAPY DIAG:  Muscle weakness (generalized)  Bilateral low back pain, unspecified chronicity, unspecified whether sciatica present  ONSET DATE: chronic  SUBJECTIVE:  SUBJECTIVE STATEMENT: Patient reports her back pain has been going on for years but comes and goes. She notices it every morning and feeling stiff. She uses heating pad and moves around, it tends to feel better. She has an adjustable mattress and finds that the pain is not as bad with her head slightly elevated.   PERTINENT HISTORY:  Per PA note on 09/01/24, She has intermittent low back pain with no leg pain x years. Pain is more constant in the morning- she has a lot of stiffness. Pain improves with heating pad and moving around. Pain also worse with getting up after prolonged sitting. No leg pain. History of recent left TKA on 02/16/24 with numbness lateral knee and occasional numbness lateral ankle.  PAIN:  Are you having pain? Yes: NPRS scale: 0/10; 10/10 worst  Pain location: low back  Pain description: stiffness, achy Aggravating factors:  sleeping, getting up first thing in the morning  Relieving factors: movement, heat  PRECAUTIONS: None  RED FLAGS: None   WEIGHT BEARING RESTRICTIONS: No  FALLS:  Has patient fallen in last 6 months? No  LIVING ENVIRONMENT: Lives with: lives alone Lives in: House/apartment  OCCUPATION: partially retired   PLOF: Independent  PATIENT GOALS: for pain to go away   OBJECTIVE:  Note: Objective measures were completed at Evaluation unless otherwise noted.  DIAGNOSTIC FINDINGS:  N/A  PATIENT SURVEYS:  Modified Oswestry:  MODIFIED OSWESTRY DISABILITY SCALE  Date: 09/07/24 Score  Pain intensity 0 = I can tolerate the pain I have without having to use pain medication.  2. Personal care (washing, dressing, etc.) 1 =  I can take care of myself normally, but it increases my pain.  3. Lifting 4 = I can lift only very light weights  4. Walking 1 = Pain prevents me from walking more than 1 mile.  5. Sitting 2 =  Pain prevents me from sitting more than 1 hour.  6. Standing 2 =  Pain prevents me from standing more than 1 hour  7. Sleeping 0 = Pain does not prevent me from sleeping well.  8. Social Life 1 =  My social life is normal, but it increases my level of pain.  9. Traveling 1 =  I can travel anywhere, but it increases my pain.  10. Employment/ Homemaking 1 = My normal homemaking/job activities increase my pain, but I can still perform all that is required of me  Total 13/50 = 26%   Interpretation of scores: Score Category Description  0-20% Minimal Disability The patient can cope with most living activities. Usually no treatment is indicated apart from advice on lifting, sitting and exercise  21-40% Moderate Disability The patient experiences more pain and difficulty with sitting, lifting and standing. Travel and social life are more difficult and they may be disabled from work. Personal care, sexual activity and sleeping are not grossly affected, and the patient can usually be  managed by conservative means  41-60% Severe Disability Pain remains the main problem in this group, but activities of daily living are affected. These patients require a detailed investigation  61-80% Crippled Back pain impinges on all aspects of the patient's life. Positive intervention is required  81-100% Bed-bound  These patients are either bed-bound or exaggerating their symptoms  Cassandra Wang DELENA Karon DELENA, et al. Surgery versus conservative management of stable thoracolumbar fracture: the PRESTO feasibility RCT. [place] ([place]): [employer]; 2021 Nov. [facility] Technology Assessment, No. 25.62.) Appendix 3, Oswestry Disability Index category  descriptors. Available from: FindJewelers.cz  Minimally Clinically Important Difference (MCID) = 12.8%  COGNITION: Overall cognitive status: Within functional limits for tasks assessed     SENSATION: WFL  MUSCLE LENGTH: Hamstrings: WFL   POSTURE: No Significant postural limitations  PALPATION: Tightness noted to lumbar paraspinals, QL, and B piriformis - slight tenderness to lumbar paraspinals   LUMBAR ROM:   AROM eval  Flexion WFL  Extension WFL  Right lateral flexion WFL  Left lateral flexion WFL  Right rotation WFL  Left rotation WFL   (Blank rows = not tested)  LOWER EXTREMITY ROM:     WFL   LOWER EXTREMITY MMT:    MMT Right eval Left eval  Hip flexion 4- 4  Hip extension    Hip abduction 4 4  Hip adduction 4+ 4+  Hip internal rotation    Hip external rotation    Knee flexion 4+ 4+  Knee extension 4+ 4+  Ankle dorsiflexion 4+ 4+  Ankle plantarflexion    Ankle inversion    Ankle eversion     (Blank rows = not tested)  LUMBAR SPECIAL TESTS:  Straight leg raise test: Negative and FABER test: Negative  FUNCTIONAL TESTS:  5 times sit to stand: 11.75 seconds 6 minute walk test: to be completed visit #2   GAIT: Distance walked: 74' Assistive device utilized:  None Level of assistance: Complete Independence Comments: no significant gait deviations   TREATMENT DATE: 09/16/24                                                                                                                                 Subjective: Patient reports 4/10 pain in R lower back.    Therapeutic Exercise:   Nustep level 5-3 x 5 minutes to improve trunk mobility and BLE strength. PT manually adjusting resistance throughout   Lower trunk rotation x 10 each direction   Sidelying open book stretch x 10 each side   SKTC 3 x 30 seconds each LE    Hooklying TrA activation with green physioball press 2 x 10 with 2-3 second hold   Hooklying TrA activation with green physioball press + alternating UE overhead lift x 10   Feet on green physioball with overhead reach with 3kg med ball 3 x 10   Modified dead bug with feet on green physioball 2 x 10   Seated twists with 3kg med ball 3 x 10     Seated forward trunk flexion with white physioball x 10, to L x 10, to R x 10  PATIENT EDUCATION:  Education details: HEP, POC, goals Person educated: Patient Education method: Explanation, Demonstration, and Handouts Education comprehension: verbalized understanding and returned demonstration  HOME EXERCISE PROGRAM: Access Code: [email or web address] URL: [email or web address] Date: 09/07/2024 Prepared by: Maryanne Finder  Exercises - Supine Lower Trunk Rotation  - 2 x daily - 5-7 x weekly - 10 reps - 3-5 second hold - Sidelying Thoracic Rotation with  Open Book  - 2 x daily - 5-7 x weekly - 10 reps - Seated Piriformis Stretch  - 2-3 x daily - 5-7 x weekly - 3-5 reps - 30-60 seconds hold - Seated Hamstring Stretch  - 2-3 x daily - 5-7 x weekly - 3-5 reps - 30-60 seconds hold  ASSESSMENT:  CLINICAL IMPRESSION:    Continued PT POC regarding low back pain/stiffness. Session focused on core strengthening and lumbar/thoracic stretching. Tolerated session well with appropriate fatigue.  Patient will benefit from skilled PT to address listed impairments to improve quality of life and reduce back pain.   OBJECTIVE IMPAIRMENTS: Abnormal gait, decreased activity tolerance, difficulty walking, decreased strength, postural dysfunction, and pain.   ACTIVITY LIMITATIONS: lifting and sleeping  PARTICIPATION LIMITATIONS: cleaning, laundry, and community activity  PERSONAL FACTORS: Age, Past/current experiences, and Time since onset of injury/illness/exacerbation are also affecting patient's functional outcome.   REHAB POTENTIAL: Good  CLINICAL DECISION MAKING: Stable/uncomplicated  EVALUATION COMPLEXITY: Low   GOALS: Goals reviewed with patient? Yes  SHORT TERM GOALS: Target date: 09/28/2024  Patient will be independent in HEP to improve strength/mobility for better functional independence with ADLs.  Baseline: 09/07/24: HEP initiated  Goal status: INITIAL  LONG TERM GOALS: Target date: 10/19/2024  Patient will reduce modified Oswestry score to <20 as to demonstrate minimal disability with ADLs including improved sleeping tolerance, walking/sitting tolerance etc for better mobility with ADLs.   Baseline: 09/07/24: 13/50 = 26% Goal status: INITIAL  2.  Patient will report a worst pain of 3/10 on NRPS in low back to improve tolerance with ADLs and reduced symptoms with activities. Baseline: 09/07/24: 10/10  Goal status: INITIAL  3.  Patient will improve 5xSTS by 3 seconds to demonstrate improvement in functional BLE strength.  Baseline: 09/07/24: 11.75 seconds  Goal status: INITIAL  4.  Patient will improve by 49m (164') in order to demonstrate clinically significant improvement in cardiopulmonary endurance and community ambulation  Baseline: 09/07/24: to be completed visit #2 Goal status: INITIAL   PLAN:  PT FREQUENCY: 1-2x/week  PT DURATION: 6 weeks  PLANNED INTERVENTIONS: 97164- PT Re-evaluation, 97750- Physical Performance Testing, 97110-Therapeutic  exercises, 97530- Therapeutic activity, [id number]- Neuromuscular re-education, 97535- Self Care, 02859- Manual therapy, [id number]- Gait training, [phone]- Electrical stimulation (unattended), [id number]- Electrical stimulation (manual), 20560 (1-2 muscles), 20561 (3+ muscles)- Dry Needling, Patient/Family education, Stair training, Spinal manipulation, Spinal mobilization, Cryotherapy, and Moist heat.  PLAN FOR NEXT SESSION: HEP review, stretching, manual STM to lumbar paraspinals, B hip strengthening   Maryanne Finder, PT, DPT Physical Therapist - [facility] 09/16/2024, 4:12 PM

## 2024-09-16 ENCOUNTER — Ambulatory Visit

## 2024-09-16 ENCOUNTER — Encounter

## 2024-09-16 DIAGNOSIS — M545 Low back pain, unspecified: Secondary | ICD-10-CM

## 2024-09-16 DIAGNOSIS — M6281 Muscle weakness (generalized): Secondary | ICD-10-CM | POA: Diagnosis not present

## 2024-09-21 ENCOUNTER — Ambulatory Visit

## 2024-09-21 DIAGNOSIS — M545 Low back pain, unspecified: Secondary | ICD-10-CM

## 2024-09-21 DIAGNOSIS — M6281 Muscle weakness (generalized): Secondary | ICD-10-CM

## 2024-09-21 NOTE — Therapy (Signed)
 OUTPATIENT PHYSICAL THERAPY THORACOLUMBAR TREATMENT   Patient Name: Cassandra Wang MRN: [id number] DOB:02/13/1966, 58 y.o., female Today's Date: 09/21/2024  END OF SESSION:  PT End of Session - 09/21/24 1642     Visit Number 4    Number of Visits 17    Date for Recertification  11/02/24    PT Start Time 1645    PT Stop Time 1726    PT Time Calculation (min) 41 min    Activity Tolerance Patient tolerated treatment well    Behavior During Therapy 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   Past Surgical History:  Procedure Laterality Date   CESAREAN SECTION  1990   CHOLECYSTECTOMY  12/16/2012   COLONOSCOPY  07/22/2013   COLONOSCOPY WITH PROPOFOL  N/A 10/13/2023   Procedure: COLONOSCOPY WITH PROPOFOL ;  Surgeon: Maryruth Ole DASEN, MD;  Location: [facility] ENDOSCOPY;  Service: Endoscopy;  Laterality: N/A;   KNEE ARTHROPLASTY Left 02/16/2024   Procedure: ARTHROPLASTY, KNEE, TOTAL, USING IMAGELESS COMPUTER-ASSISTED NAVIGATION;  Surgeon: Mardee Lynwood SQUIBB, MD;  Location: [facility] ORS;  Service: Orthopedics;  Laterality: Left;   VAGINAL HYSTERECTOMY  2005   Patient Active Problem List   Diagnosis Date Noted   History of total knee arthroplasty, left 02/16/2024   Complex cyst of left ovary 02/15/2024   Pelvic pain in female 02/15/2024   Primary osteoarthritis of left knee 02/02/2023   S/P cholecystectomy 12/25/2012   Hepatic hemangioma 12/25/2012    S/P hysterectomy 12/25/2012   Rubella during pregnancy 12/25/2012   H/O mumps 12/25/2012   History of chicken pox 12/25/2012   Absence of both cervix and uterus, acquired 12/25/2012   Other viral diseases complicating pregnancy, unspecified trimester 12/25/2012   Personal history of other infectious and parasitic diseases 12/25/2012    PCP: Valora Lynwood, MD  REFERRING PROVIDER: Hilma Hastings, PA-C   REFERRING DIAG:  [phone] (ICD-10-CM) - Chronic bilateral low back pain without sciatica M47.816 (ICD-10-CM) - Lumbar spondylosis M51.360 (ICD-10-CM) - Degeneration of intervertebral disc of lumbar region with discogenic back pain  Rationale for Evaluation and Treatment: Rehabilitation  THERAPY DIAG:  Muscle weakness (generalized)  Bilateral low back pain, unspecified chronicity, unspecified whether sciatica present  ONSET DATE: chronic  SUBJECTIVE:  SUBJECTIVE STATEMENT: Patient reports her back pain has been going on for years but comes and goes. She notices it every morning and feeling stiff. She uses heating pad and moves around, it tends to feel better. She has an adjustable mattress and finds that the pain is not as bad with her head slightly elevated.   PERTINENT HISTORY:  Per PA note on 09/01/24, She has intermittent low back pain with no leg pain x years. Pain is more constant in the morning- she has a lot of stiffness. Pain improves with heating pad and moving around. Pain also worse with getting up after prolonged sitting. No leg pain. History of recent left TKA on 02/16/24 with numbness lateral knee and occasional numbness lateral ankle.  PAIN:  Are you having pain? Yes: NPRS scale: 0/10; 10/10 worst  Pain location: low back  Pain description: stiffness, achy Aggravating factors:  sleeping, getting up first thing in the morning  Relieving factors: movement, heat  PRECAUTIONS: None  RED FLAGS: None   WEIGHT BEARING RESTRICTIONS: No  FALLS:  Has patient fallen in last 6 months? No  LIVING ENVIRONMENT: Lives with: lives alone Lives in: House/apartment  OCCUPATION: partially retired   PLOF: Independent  PATIENT GOALS: for pain to go away   OBJECTIVE:  Note: Objective measures were completed at Evaluation unless otherwise noted.  DIAGNOSTIC FINDINGS:  N/A  PATIENT SURVEYS:  Modified Oswestry:  MODIFIED OSWESTRY DISABILITY SCALE  Date: 09/07/24 Score  Pain intensity 0 = I can tolerate the pain I have without having to use pain medication.  2. Personal care (washing, dressing, etc.) 1 =  I can take care of myself normally, but it increases my pain.  3. Lifting 4 = I can lift only very light weights  4. Walking 1 = Pain prevents me from walking more than 1 mile.  5. Sitting 2 =  Pain prevents me from sitting more than 1 hour.  6. Standing 2 =  Pain prevents me from standing more than 1 hour  7. Sleeping 0 = Pain does not prevent me from sleeping well.  8. Social Life 1 =  My social life is normal, but it increases my level of pain.  9. Traveling 1 =  I can travel anywhere, but it increases my pain.  10. Employment/ Homemaking 1 = My normal homemaking/job activities increase my pain, but I can still perform all that is required of me  Total 13/50 = 26%   Interpretation of scores: Score Category Description  0-20% Minimal Disability The patient can cope with most living activities. Usually no treatment is indicated apart from advice on lifting, sitting and exercise  21-40% Moderate Disability The patient experiences more pain and difficulty with sitting, lifting and standing. Travel and social life are more difficult and they may be disabled from work. Personal care, sexual activity and sleeping are not grossly affected, and the patient can usually be  managed by conservative means  41-60% Severe Disability Pain remains the main problem in this group, but activities of daily living are affected. These patients require a detailed investigation  61-80% Crippled Back pain impinges on all aspects of the patient's life. Positive intervention is required  81-100% Bed-bound  These patients are either bed-bound or exaggerating their symptoms  Bluford FORBES [name] DELENA Karon DELENA, et al. Surgery versus conservative management of stable thoracolumbar fracture: the PRESTO feasibility RCT. [place] ([place]): [employer]; 2021 Nov. [facility] Technology Assessment, No. 25.62.) Appendix 3, Oswestry Disability Index category  descriptors. Available from: FindJewel[employer].cz  Minimally Clinically Important Difference (MCID) = 12.8%  COGNITION: Overall cognitive status: Within functional limits for tasks assessed     SENSATION: WFL  MUSCLE LENGTH: Hamstrings: WFL   POSTURE: No Significant postural limitations  PALPATION: Tightness noted to lumbar paraspinals, QL, and B piriformis - slight tenderness to lumbar paraspinals   LUMBAR ROM:   AROM eval  Flexion WFL  Extension WFL  Right lateral flexion WFL  Left lateral flexion WFL  Right rotation WFL  Left rotation WFL   (Blank rows = not tested)  LOWER EXTREMITY ROM:     WFL   LOWER EXTREMITY MMT:    MMT Right eval Left eval  Hip flexion 4- 4  Hip extension    Hip abduction 4 4  Hip adduction 4+ 4+  Hip internal rotation    Hip external rotation    Knee flexion 4+ 4+  Knee extension 4+ 4+  Ankle dorsiflexion 4+ 4+  Ankle plantarflexion    Ankle inversion    Ankle eversion     (Blank rows = not tested)  LUMBAR SPECIAL TESTS:  Straight leg raise test: Negative and FABER test: Negative  FUNCTIONAL TESTS:  5 times sit to stand: 11.75 seconds 6 minute walk test: to be completed visit #2   GAIT: Distance walked: 42' Assistive device utilized:  None Level of assistance: Complete Independence Comments: no significant gait deviations   TREATMENT DATE: 09/21/24                                                                                                                                 Subjective: Patient reports no pain on arrival in her back but she is having a migraine. Preparing for her trip to [place] to handle family situation after her dad passed away in 2024/02/27. She will be gone for 2 weeks.    Therapeutic Exercise:   Nustep level 5-3 x 5 minutes to improve trunk mobility and BLE strength. PT manually adjusting resistance throughout   Supine marching with GTB around knees 3 x 10 each side    Supine bridge with GTB around knees 3 x 10    Hooklying curl up with reach to opposite knee 2 x 10 each side     Sidelying clamshell 3 x 10 each side    Seated marching with GTB around knees 3 x 10 each LE     Seated knee extension with GTB around ankles 3 x 10     Forward physioball rollout stretch x 10, to L x 10, to R x 10   Updated HEP with core and LE strengthening while she is gone on her trip.    PATIENT EDUCATION:  Education details: HEP, POC, goals Person educated: Patient Education method: Explanation, Demonstration, and Handouts Education comprehension: verbalized understanding and returned demonstration  HOME EXERCISE PROGRAM: Access Code: [email or web address] URL: [email or web address] Date: 09/21/2024 Prepared by: Maryanne Finder  Exercises -  Supine Lower Trunk Rotation  - 2 x daily - 5-7 x weekly - 10 reps - 3-5 second hold - Sidelying Thoracic Rotation with Open Book  - 2 x daily - 5-7 x weekly - 10 reps - Seated Piriformis Stretch  - 2-3 x daily - 5-7 x weekly - 3-5 reps - 30-60 seconds hold - Seated Hamstring Stretch  - 2-3 x daily - 5-7 x weekly - 3-5 reps - 30-60 seconds hold - Supine Bridge with Resistance Band  - 1-2 x daily - 5-7 x weekly - 3 sets - 10 reps - Supine March with Resistance Band   - 1-2 x daily - 5-7 x weekly - 3 sets - 10 reps - Clamshell with Resistance  - 1-2 x daily - 5-7 x weekly - 3 sets - 10 reps - Diagonal Curl Up with Reach  - 1-2 x daily - 5-7 x weekly - 3 sets - 10 reps - Seated March with Resistance  - 1-2 x daily - 5-7 x weekly - 3 sets - 10 reps - Seated Knee Extension with Resistance  - 1-2 x daily - 5-7 x weekly - 3 sets - 10 reps  ASSESSMENT:  CLINICAL IMPRESSION:    Continued PT POC regarding low back pain/stiffness. Session focused on HEP creation for her 2 week trip to [place] for family. Tolerated newly introduced exercises well. Provided patient with printout as well as red and green theraband. Patient will benefit from skilled PT to address listed impairments to improve quality of life and reduce back pain.   OBJECTIVE IMPAIRMENTS: Abnormal gait, decreased activity tolerance, difficulty walking, decreased strength, postural dysfunction, and pain.   ACTIVITY LIMITATIONS: lifting and sleeping  PARTICIPATION LIMITATIONS: cleaning, laundry, and community activity  PERSONAL FACTORS: Age, Past/current experiences, and Time since onset of injury/illness/exacerbation are also affecting patient's functional outcome.   REHAB POTENTIAL: Good  CLINICAL DECISION MAKING: Stable/uncomplicated  EVALUATION COMPLEXITY: Low   GOALS: Goals reviewed with patient? Yes  SHORT TERM GOALS: Target date: 09/28/2024  Patient will be independent in HEP to improve strength/mobility for better functional independence with ADLs.  Baseline: 09/07/24: HEP initiated  Goal status: INITIAL  LONG TERM GOALS: Target date: 10/19/2024  Patient will reduce modified Oswestry score to <20 as to demonstrate minimal disability with ADLs including improved sleeping tolerance, walking/sitting tolerance etc for better mobility with ADLs.   Baseline: 09/07/24: 13/50 = 26% Goal status: INITIAL  2.  Patient will report a worst pain of 3/10 on NRPS in low back to improve tolerance  with ADLs and reduced symptoms with activities. Baseline: 09/07/24: 10/10  Goal status: INITIAL  3.  Patient will improve 5xSTS by 3 seconds to demonstrate improvement in functional BLE strength.  Baseline: 09/07/24: 11.75 seconds  Goal status: INITIAL  4.  Patient will improve by 45m (164') in order to demonstrate clinically significant improvement in cardiopulmonary endurance and community ambulation  Baseline: 09/07/24: to be completed visit #2 Goal status: INITIAL   PLAN:  PT FREQUENCY: 1-2x/week  PT DURATION: 6 weeks  PLANNED INTERVENTIONS: 97164- PT Re-evaluation, 97750- Physical Performance Testing, 97110-Therapeutic exercises, 97530- Therapeutic activity, [id number]- Neuromuscular re-education, 97535- Self Care, [id number]- Manual therapy, [id number]- Gait training, [id number]- Electrical stimulation (unattended), [id number]- Electrical stimulation (manual), 20560 (1-2 muscles), 20561 (3+ muscles)- Dry Needling, Patient/Family education, Stair training, Spinal manipulation, Spinal mobilization, Cryotherapy, and Moist heat.  PLAN FOR NEXT SESSION: HEP review, stretching, manual STM to lumbar paraspinals, B hip strengthening   Maryanne Finder, PT, DPT Physical  Therapist - [place]  [facility] 09/21/2024, 4:43 PM

## 2024-09-28 ENCOUNTER — Encounter

## 2024-09-30 ENCOUNTER — Encounter

## 2024-10-05 ENCOUNTER — Encounter

## 2024-10-07 ENCOUNTER — Encounter

## 2024-10-12 ENCOUNTER — Ambulatory Visit

## 2024-10-14 ENCOUNTER — Ambulatory Visit

## 2024-10-19 ENCOUNTER — Ambulatory Visit: Attending: Orthopedic Surgery

## 2024-10-19 DIAGNOSIS — M545 Low back pain, unspecified: Secondary | ICD-10-CM | POA: Diagnosis present

## 2024-10-19 DIAGNOSIS — M6281 Muscle weakness (generalized): Secondary | ICD-10-CM | POA: Insufficient documentation

## 2024-10-19 NOTE — Therapy (Signed)
 OUTPATIENT PHYSICAL THERAPY THORACOLUMBAR TREATMENT/ RE CERTIFICATION   Patient Name: Cassandra Wang MRN: [id number] DOB:1966-05-02, 58 y.o., female Today's Date: 10/19/2024  END OF SESSION:  PT End of Session - 10/19/24 1422     Visit Number 5    Number of Visits 17    Date for Recertification  11/10/24    PT Start Time 1430    PT Stop Time 1514    PT Time Calculation (min) 44 min    Activity Tolerance Patient tolerated treatment well    Behavior During Therapy [facility] for tasks assessed/performed              Past Medical History:  Diagnosis Date   Abnormal Pap smear of cervix    Cervical radiculopathy    Dense breast tissue    Diverticulosis 2014   DJD (degenerative joint disease)    GERD (gastroesophageal reflux disease)    Hammer toe of right foot    Heart murmur    Hepatic hemangioma    right lobe   Mitral valve insufficiency and aortic valve insufficiency    mild   Seasonal allergies    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   Past Surgical History:  Procedure Laterality Date   CESAREAN SECTION  1990   CHOLECYSTECTOMY  12/16/2012   COLONOSCOPY  07/22/2013   COLONOSCOPY WITH PROPOFOL  N/A 10/13/2023   Procedure: COLONOSCOPY WITH PROPOFOL ;  Surgeon: Maryruth Ole DASEN, MD;  Location: [facility] ENDOSCOPY;  Service: Endoscopy;  Laterality: N/A;   KNEE ARTHROPLASTY Left 02/16/2024   Procedure: ARTHROPLASTY, KNEE, TOTAL, USING IMAGELESS COMPUTER-ASSISTED NAVIGATION;  Surgeon: Mardee Lynwood SQUIBB, MD;  Location: [facility] ORS;  Service: Orthopedics;  Laterality: Left;   VAGINAL HYSTERECTOMY  2005   Patient Active Problem List   Diagnosis Date Noted   History of total knee arthroplasty, left 02/16/2024   Complex cyst of left ovary 02/15/2024   Pelvic pain in female 02/15/2024   Primary osteoarthritis of left knee 02/02/2023   S/P cholecystectomy 12/25/2012   Hepatic  hemangioma 12/25/2012   S/P hysterectomy 12/25/2012   Rubella during pregnancy 12/25/2012   H/O mumps 12/25/2012   History of chicken pox 12/25/2012   Absence of both cervix and uterus, acquired 12/25/2012   Other viral diseases complicating pregnancy, unspecified trimester 12/25/2012   Personal history of other infectious and parasitic diseases 12/25/2012    PCP: Valora Lynwood, MD  REFERRING PROVIDER: Hilma Hastings, PA-C   REFERRING DIAG:  [phone] (ICD-10-CM) - Chronic bilateral low back pain without sciatica M47.816 (ICD-10-CM) - Lumbar spondylosis M51.360 (ICD-10-CM) - Degeneration of intervertebral disc of lumbar region with discogenic back pain  Rationale for Evaluation and Treatment: Rehabilitation  THERAPY DIAG:  Muscle weakness (generalized)  Bilateral low back pain, unspecified chronicity, unspecified whether sciatica present  ONSET DATE: chronic  SUBJECTIVE:  SUBJECTIVE STATEMENT: Patient reports her back pain has been going on for years but comes and goes. She notices it every morning and feeling stiff. She uses heating pad and moves around, it tends to feel better. She has an adjustable mattress and finds that the pain is not as bad with her head slightly elevated.   PERTINENT HISTORY:  Per PA note on 09/01/24, She has intermittent low back pain with no leg pain x years. Pain is more constant in the morning- she has a lot of stiffness. Pain improves with heating pad and moving around. Pain also worse with getting up after prolonged sitting. No leg pain. History of recent left TKA on 02/16/24 with numbness lateral knee and occasional numbness lateral ankle.  PAIN:  Are you having pain? Yes: NPRS scale: 0/10; 10/10 worst  Pain location: low back  Pain description: stiffness,  achy Aggravating factors: sleeping, getting up first thing in the morning  Relieving factors: movement, heat  PRECAUTIONS: None  RED FLAGS: None   WEIGHT BEARING RESTRICTIONS: No  FALLS:  Has patient fallen in last 6 months? No  LIVING ENVIRONMENT: Lives with: lives alone Lives in: House/apartment  OCCUPATION: partially retired   PLOF: Independent  PATIENT GOALS: for pain to Brodyn Depuy away   OBJECTIVE:  Note: Objective measures were completed at Evaluation unless otherwise noted.  DIAGNOSTIC FINDINGS:  N/A  PATIENT SURVEYS:  Modified Oswestry:  MODIFIED OSWESTRY DISABILITY SCALE  Date: 09/07/24 Score  Pain intensity 0 = I can tolerate the pain I have without having to use pain medication.  2. Personal care (washing, dressing, etc.) 1 =  I can take care of myself normally, but it increases my pain.  3. Lifting 4 = I can lift only very light weights  4. Walking 1 = Pain prevents me from walking more than 1 mile.  5. Sitting 2 =  Pain prevents me from sitting more than 1 hour.  6. Standing 2 =  Pain prevents me from standing more than 1 hour  7. Sleeping 0 = Pain does not prevent me from sleeping well.  8. Social Life 1 =  My social life is normal, but it increases my level of pain.  9. Traveling 1 =  I can travel anywhere, but it increases my pain.  10. Employment/ Homemaking 1 = My normal homemaking/job activities increase my pain, but I can still perform all that is required of me  Total 13/50 = 26%   Interpretation of scores: Score Category Description  0-20% Minimal Disability The patient can cope with most living activities. Usually no treatment is indicated apart from advice on lifting, sitting and exercise  21-40% Moderate Disability The patient experiences more pain and difficulty with sitting, lifting and standing. Travel and social life are more difficult and they may be disabled from work. Personal care, sexual activity and sleeping are not grossly affected, and the  patient can usually be managed by conservative means  41-60% Severe Disability Pain remains the main problem in this group, but activities of daily living are affected. These patients require a detailed investigation  61-80% Crippled Back pain impinges on all aspects of the patient's life. Positive intervention is required  81-100% Bed-bound  These patients are either bed-bound or exaggerating their symptoms  Bluford FORBES [name] DELENA Karon DELENA, et al. Surgery versus conservative management of stable thoracolumbar fracture: the PRESTO feasibility RCT. [place] ([place]): [employer]; 2021 Nov. [facility] Technology Assessment, No. 25.62.) Appendix 3, Oswestry Disability Index category  descriptors. Available from: Findjewelers.cz  Minimally Clinically Important Difference (MCID) = 12.8%  COGNITION: Overall cognitive status: Within functional limits for tasks assessed     SENSATION: WFL  MUSCLE LENGTH: Hamstrings: WFL   POSTURE: No Significant postural limitations  PALPATION: Tightness noted to lumbar paraspinals, QL, and B piriformis - slight tenderness to lumbar paraspinals   LUMBAR ROM:   AROM eval  Flexion WFL  Extension WFL  Right lateral flexion WFL  Left lateral flexion WFL  Right rotation WFL  Left rotation WFL   (Blank rows = not tested)  LOWER EXTREMITY ROM:     WFL   LOWER EXTREMITY MMT:    MMT Right eval Left eval  Hip flexion 4- 4  Hip extension    Hip abduction 4 4  Hip adduction 4+ 4+  Hip internal rotation    Hip external rotation    Knee flexion 4+ 4+  Knee extension 4+ 4+  Ankle dorsiflexion 4+ 4+  Ankle plantarflexion    Ankle inversion    Ankle eversion     (Blank rows = not tested)  LUMBAR SPECIAL TESTS:  Straight leg raise test: Negative and FABER test: Negative  FUNCTIONAL TESTS:  5 times sit to stand: 11.75 seconds 6 minute walk test: to be completed visit #2   GAIT: Distance walked: 67' Assistive  device utilized: None Level of assistance: Complete Independence Comments: no significant gait deviations   TREATMENT DATE: 10/19/24                                                                                                                               PATIENT SURVEYS: Modified Oswestry:  MODIFIED OSWESTRY DISABILITY SCALE  Date: 10/19/2024 Score  Pain intensity 0 = I can tolerate the pain I have without having to use pain medication.  2. Personal care (washing, dressing, etc.) 1 =  I can take care of myself normally, but it increases my pain.  3. Lifting 4 = I can lift only very light weights  4. Walking 1 = Pain prevents me from walking more than 1 mile.  5. Sitting 2 =  Pain prevents me from sitting more than 1 hour.  6. Standing 4 =  Pain prevents me from standing more than 10 minutes.  7. Sleeping 0 = Pain does not prevent me from sleeping well.  8. Social Life 2 = Pain prevents me from participating in more energetic activities (eg. sports, dancing).  9. Traveling 1 =  I can travel anywhere, but it increases my pain.  10. Employment/ Homemaking 3 = Pain prevents me from doing anything but light duties.  Total 18/50   Interpretation of scores: Score Category Description  0-20% Minimal Disability The patient can cope with most living activities. Usually no treatment is indicated apart from advice on lifting, sitting and exercise  21-40% Moderate Disability The patient experiences more pain and difficulty with sitting, lifting and standing. Travel and social life  are more difficult and they may be disabled from work. Personal care, sexual activity and sleeping are not grossly affected, and the patient can usually be managed by conservative means  41-60% Severe Disability Pain remains the main problem in this group, but activities of daily living are affected. These patients require a detailed investigation  61-80% Crippled Back pain impinges on all aspects of the patient's life.  Positive intervention is required  81-100% Bed-bound These patients are either bed-bound or exaggerating their symptoms  Bluford FORBES [name] DELENA Karon DELENA, et al. Surgery versus conservative management of stable thoracolumbar fracture: the PRESTO feasibility RCT. [place] ([place]): [employer]; 2021 Nov. [facility] Technology Assessment, No. 25.62.) Appendix 3, Oswestry Disability Index category descriptors. Available from: Findjewelers.cz  Minimally Clinically Important Difference (MCID) = 12.8%     Subjective: Patient reports that she returned from her trip from [place]. She was severely fatigued from the flights(jet lag), time change and then she fell on her L leg. She reports that her lower back has had improvements. Prior to start of session 0/10 NPS. No questions or concerns.   Physical Performance Measure:  : 1412' ft This is a 1-item objective measure designed to assess submaximal aerobic/functional walking capacity, community walking prediction, and serve as a predictor of  morbidity and mortality in cardiac patients.  5TSTS: 9.52s  - ([place])   The Five Times Sit to Stand Test measures one aspect of transfer skill. The test provides a method to quantify functional lower extremity strength and/or identify movement strategies a patient uses to complete transitional movements.   60-69 years: 8.4  0.0 seconds (female), 12.7  1.8 seconds (female)   Time spent discussing set up and results/age norms with patient.   Therapeutic Exercise:    Supine 90/90 with Alternating LE extension    3 x 10    Supine 90/90 with OH Medball reach    2 x 10 - 3 Kg Med Ball    Side Plank    R/L: 3 x 10s hold    PT education on updated HEP and continued adherence, frequency, sets and reps     Therapeutic Activity:  Nustep level 5-3 x 5 minutes to improve trunk mobility and BLE strength. PT manually adjusting resistance within patient tolerance.   Lateral  Stepping Against Resistance     4 x 12' Blue TB around thigh    4 x 12' Blue TB around ankle    Kettle Bell Squat    1 x 10 - 10#   1 x 8 - 20#    PATIENT EDUCATION:  Education details: HEP, POC, goals Person educated: Patient Education method: Explanation, Demonstration, and Handouts Education comprehension: verbalized understanding and returned demonstration  HOME EXERCISE PROGRAM: Access Code: [email or web address] URL: [email or web address] Date: 10/19/2024 Prepared by: Lonni Pall  Exercises - Supine Lower Trunk Rotation  - 2 x daily - 5-7 x weekly - 10 reps - 3-5 second hold - Sidelying Thoracic Rotation with Open Book  - 2 x daily - 5-7 x weekly - 10 reps - Seated Piriformis Stretch  - 2-3 x daily - 5-7 x weekly - 3-5 reps - 30-60 seconds hold - Seated Hamstring Stretch  - 2-3 x daily - 5-7 x weekly - 3-5 reps - 30-60 seconds hold - Supine Bridge with Resistance Band  - 1-2 x daily - 5-7 x weekly - 3 sets - 10 reps - Supine March with Resistance Band  - 1-2 x daily - 5-7 x weekly -  3 sets - 10 reps - Clamshell with Resistance  - 1-2 x daily - 5-7 x weekly - 3 sets - 10 reps - Diagonal Curl Up with Reach  - 1-2 x daily - 5-7 x weekly - 3 sets - 10 reps - Seated March with Resistance  - 1-2 x daily - 5-7 x weekly - 3 sets - 10 reps - Seated Knee Extension with Resistance  - 1-2 x daily - 5-7 x weekly - 3 sets - 10 reps - Side Stepping with Resistance at Feet  - 1 x daily - 5-7 x weekly - 2-3 sets - 10-12 reps - Supine 90/90 with Leg Extensions  - 1 x daily - 5-7 x weekly - 2-3 sets - 10-12 reps - Side Plank on Knees  - 1 x daily - 5-7 x weekly - 2-3 sets - 10-12 reps  Access Code: [email or web address] URL: [email or web address] Date: 09/21/2024 Prepared by: Maryanne Finder  Exercises - Supine Lower Trunk Rotation  - 2 x daily - 5-7 x weekly - 10 reps - 3-5 second hold - Sidelying Thoracic Rotation with Open Book  - 2 x daily - 5-7 x weekly - 10 reps - Seated  Piriformis Stretch  - 2-3 x daily - 5-7 x weekly - 3-5 reps - 30-60 seconds hold - Seated Hamstring Stretch  - 2-3 x daily - 5-7 x weekly - 3-5 reps - 30-60 seconds hold - Supine Bridge with Resistance Band  - 1-2 x daily - 5-7 x weekly - 3 sets - 10 reps - Supine March with Resistance Band  - 1-2 x daily - 5-7 x weekly - 3 sets - 10 reps - Clamshell with Resistance  - 1-2 x daily - 5-7 x weekly - 3 sets - 10 reps - Diagonal Curl Up with Reach  - 1-2 x daily - 5-7 x weekly - 3 sets - 10 reps - Seated March with Resistance  - 1-2 x daily - 5-7 x weekly - 3 sets - 10 reps - Seated Knee Extension with Resistance  - 1-2 x daily - 5-7 x weekly - 3 sets - 10 reps  ASSESSMENT:  CLINICAL IMPRESSION:    Continued PT POC focused on LBP and stiffness. PT reassessed progress towards goals given 3 week hiatus from OPPT. Patient is making steady progress towards LE strength and endurance goals (see below). She still has intermittent lower back pain that can limit proglonged standing, walking or sitting. She self reported 18/50 = 36% which is a regression from her last report. Patient still having difficulties with lifting and standing due to her lower back pain.  Remainder of session focused on LE strengthening and core stabilization. She tolerated all exercises without exacerbation of lower back pain. Based on today's performance, Pt strongly recommends continued PT services in order to address her current deficits and maximize safe discharge to minimize further injury to lower back.   OBJECTIVE IMPAIRMENTS: Abnormal gait, decreased activity tolerance, difficulty walking, decreased strength, postural dysfunction, and pain.   ACTIVITY LIMITATIONS: lifting and sleeping  PARTICIPATION LIMITATIONS: cleaning, laundry, and community activity  PERSONAL FACTORS: Age, Past/current experiences, and Time since onset of injury/illness/exacerbation are also affecting patient's functional outcome.   REHAB POTENTIAL:  Good  CLINICAL DECISION MAKING: Stable/uncomplicated  EVALUATION COMPLEXITY: Low   GOALS: Goals reviewed with patient? Yes  SHORT TERM GOALS: Target date: 09/28/2024  Patient will be independent in HEP to improve strength/mobility for better functional independence with ADLs.  Baseline: 09/07/24: HEP initiated;  10/19/2024: 60% adherence vocalized  Goal status: Progressing  LONG TERM GOALS: Target date: 11/10/2024  Patient will reduce modified Oswestry score to <20 as to demonstrate minimal disability with ADLs including improved sleeping tolerance, walking/sitting tolerance etc for better mobility with ADLs.   Baseline: 09/07/24: 13/50 = 26%; 10/19/2024: 18/50 = 36% Goal status: Progressing   2.  Patient will report a worst pain of 3/10 on NRPS in low back to improve tolerance with ADLs and reduced symptoms with activities. Baseline: 09/07/24: 10/10; 10/19/2024: 3/10 NPS Goal status: Goal Met  3.  Patient will improve 5xSTS by 3 seconds to demonstrate improvement in functional BLE strength.  Baseline: 09/07/24: 11.75 seconds; 10/19/2024: 9.52s Goal status: Progressing  4.  Patient will improve by 17m (164') in order to demonstrate clinically significant improvement in cardiopulmonary endurance and community ambulation  Baseline: 09/07/24: to be completed visit #2; 09/13/2024: 1368' with no AD 10/19/2024: 1413' pain in the L gluteal and lower back Goal status: Progressing    PLAN:  PT FREQUENCY: 1-2x/week  PT DURATION: 6 weeks  PLANNED INTERVENTIONS: 97164- PT Re-evaluation, 97750- Physical Performance Testing, 97110-Therapeutic exercises, 97530- Therapeutic activity, 97112- Neuromuscular re-education, 97535- Self Care, 02859- Manual therapy, [id number]- Gait training, H9716- Electrical stimulation (unattended), [id number]- Electrical stimulation (manual), 20560 (1-2 muscles), 20561 (3+ muscles)- Dry Needling, Patient/Family education, Stair training, Spinal manipulation, Spinal  mobilization, Cryotherapy, and Moist heat.  PLAN FOR NEXT SESSION: HEP review, stretching, manual STM to lumbar paraspinals, B hip strengthening   Lonni Pall PT, DPT Physical Therapist- [facility] 10/19/2024, 4:57 PM

## 2024-10-21 ENCOUNTER — Encounter

## 2024-10-25 NOTE — Progress Notes (Unsigned)
 Referring Physician:  Valora Lynwood FALCON, MD [address] [place],  [place] [address]  Primary Physician:  Valora Lynwood FALCON, MD  History of Present Illness: Ms. Cassandra Wang has a history of has a history diverticulosis, GERD, heart murmur.   Last seen by me on 09/01/24 for intermittent LBP with no leg pain. She has known mild lumbar spondylosis mild DDD L5-S1. Pain likely due to underlying spondylosis and DDD.   She was sent to PT and is here for follow up.   She had initial PT evaluation on 09/07/24 with 5 visits through 10/26/24.  PT has helped her back. She has no back pain, but has new intermittent right buttock pain. PT is working on this. It is worse with twisting (washing window). Better with stretching.   Overall, she is 60-70% better than her last visit.   She is not taking anything currently for her back. She's not a big medication person.   Tobacco use: Does not smoke.    Conservative measures:  Physical therapy: initial evaluation on 09/07/24 with 5 visits through 10/27/24. Multimodal medical therapy including regular antiinflammatories:  tylenol , oxycodone , prednisone, tramadol   Injections: no epidural steroid injections  Past Surgery: no spinal surgeries  Lavonda P Garfield has no symptoms of cervical myelopathy.  The symptoms are causing a significant impact on the patient's life.   Review of Systems:  A 10 point review of systems is negative, except for the pertinent positives and negatives detailed in the HPI.  Past Medical History: Past Medical History:  Diagnosis Date   Abnormal Pap smear of cervix    Cervical radiculopathy    Dense breast tissue    Diverticulosis 2014   DJD (degenerative joint disease)    GERD (gastroesophageal reflux disease)    Hammer toe of right foot    Heart murmur    Hepatic hemangioma    right lobe   Mitral valve insufficiency and aortic valve insufficiency    mild   Seasonal allergies    VAIN  (vaginal intraepithelial neoplasia)    h/o VAIN II in 2008, treated with Effudex, 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    Past Surgical History: Past Surgical History:  Procedure Laterality Date   CESAREAN SECTION  1990   CHOLECYSTECTOMY  12/16/2012   COLONOSCOPY  07/22/2013   COLONOSCOPY WITH PROPOFOL  N/A 10/13/2023   Procedure: COLONOSCOPY WITH PROPOFOL ;  Surgeon: Maryruth Ole DASEN, MD;  Location: [facility] ENDOSCOPY;  Service: Endoscopy;  Laterality: N/A;   KNEE ARTHROPLASTY Left 02/16/2024   Procedure: ARTHROPLASTY, KNEE, TOTAL, USING IMAGELESS COMPUTER-ASSISTED NAVIGATION;  Surgeon: Mardee Lynwood SQUIBB, MD;  Location: [facility] ORS;  Service: Orthopedics;  Laterality: Left;   VAGINAL HYSTERECTOMY  2005    Allergies: Allergies as of 10/27/2024 - Review Complete 10/19/2024  Allergen Reaction Noted   Chlorhexidine  Itching and Rash 09/19/2015   Sulfites Swelling 02/16/2020   Augmentin [amoxicillin-pot clavulanate] Swelling 10/06/2023   Etodolac Swelling 02/06/2024   Sulfa antibiotics Swelling 02/12/2020   Gluconate Rash 11/16/2015   Latex Itching and Rash 11/16/2015   Silicone Dermatitis 02/16/2014    Medications: Outpatient Encounter Medications as of 10/27/2024  Medication Sig   acetaminophen  (TYLENOL ) 500 MG tablet Take 1,000 mg by mouth every 6 (six) hours as needed for moderate pain (pain score 4-6).   aspirin  81 MG chewable tablet Chew 1 tablet (81 mg total) by mouth 2 (two) times daily.   cetirizine (ZYRTEC) 10 MG tablet Take 10 mg  by mouth daily as needed for allergies.   Cholecalciferol (VITAMIN D3) 125 MCG (5000 UT) CAPS Take 5,000 Units by mouth daily.   fluticasone  (FLONASE ) 50 MCG/ACT nasal spray Place 2 sprays into both nostrils daily as needed for allergies.   meloxicam (MOBIC) 15 MG tablet Take 15 mg by mouth daily as needed for pain.   Misc Natural Products (OSTEO BI-FLEX JOINT SHIELD PO) Take 2 tablets by mouth daily.   Multiple Vitamins-Minerals  (MULTIVITAMIN WOMENS 50+ ADV PO) Take 1 tablet by mouth daily.   Omega-3 Fatty Acids (FISH OIL ULTRA) 1400 MG CAPS Take 1,400 mg by mouth daily.   oxyCODONE  (OXY IR/ROXICODONE ) 5 MG immediate release tablet Take 1 tablet (5 mg total) by mouth every 4 (four) hours as needed for moderate pain (pain score 4-6) (pain score 4-6).   pantoprazole  (PROTONIX ) 40 MG tablet Take 40 mg by mouth daily.   predniSONE (DELTASONE) 20 MG tablet    Propylene Glycol (SYSTANE COMPLETE) 0.6 % SOLN Place 1 drop into both eyes as needed (dry eyes). (Patient not taking: Reported on 02/16/2024)   traMADol  (ULTRAM ) 50 MG tablet Take 1-2 tablets (50-100 mg total) by mouth every 4 (four) hours as needed for moderate pain (pain score 4-6).   No facility-administered encounter medications on file as of 10/27/2024.    Social History: Social History   Tobacco Use   Smoking status: Never   Smokeless tobacco: Never  Vaping Use   Vaping status: Never Used  Substance Use Topics   Alcohol  use: Yes    Comment: rarely   Drug use: No    Family Medical History: Family History  Problem Relation Age of Onset   Breast cancer Paternal Aunt 98    Physical Examination: There were no vitals filed for this visit.    Awake, alert, oriented to person, place, and time.  Speech is clear and fluent. Fund of knowledge is appropriate.   Cranial Nerves: Pupils equal round and reactive to light.  Facial tone is symmetric.    Mild tenderness right buttock/SI joint.   No abnormal lesions on exposed skin.   Strength: Side Iliopsoas Quads Hamstring PF DF EHL  R 5 5 5 5 5 5   L 5 5 5 5 5 5    Reflexes are 2+ and symmetric at the patella and achilles.    Clonus is not present.   Bilateral lower extremity sensation is intact to light touch.     No pain with IR/ER of both hips.   Gait is normal.    Medical Decision Making  Imaging: none  Assessment and Plan: Ms. Morand has seen improvement with PT. She has no back pain,  but has new intermittent right buttock pain.   She has known mild lumbar spondylosis mild DDD L5-S1. Pain likely due to underlying spondylosis and DDD.    Treatment options discussed with patient and following plan made:   - Continue with PT for lumbar spine.  - Pain not bad enough to consider medications.  - Discussed getting MRI and she declines.  - I expect she will improve with PT. Will call her in 6 weeks to check on her progress.   I spent a total of 15 minutes in face-to-face and non-face-to-face activities related to this patient's care today including review of outside records, review of imaging, review of symptoms, physical exam, discussion of differential diagnosis, discussion of treatment options, and documentation.   Thank you for involving me in the care of this patient.  Glade Boys PA-C Dept. of Neurosurgery

## 2024-10-26 ENCOUNTER — Ambulatory Visit

## 2024-10-26 DIAGNOSIS — M545 Low back pain, unspecified: Secondary | ICD-10-CM

## 2024-10-26 DIAGNOSIS — M6281 Muscle weakness (generalized): Secondary | ICD-10-CM | POA: Diagnosis not present

## 2024-10-26 NOTE — Therapy (Signed)
 OUTPATIENT PHYSICAL THERAPY THORACOLUMBAR TREATMENT/ RE CERTIFICATION   Patient Name: Cassandra Wang MRN: [id number] DOB:1966-08-10, 58 y.o., female Today's Date: 10/26/2024  END OF SESSION:  PT End of Session - 10/26/24 1431     Visit Number 6    Number of Visits 17    Date for Recertification  11/10/24    PT Start Time 1431    PT Stop Time 1510    PT Time Calculation (min) 39 min    Activity Tolerance Patient tolerated treatment well    Behavior During Therapy [facility] for tasks assessed/performed              Past Medical History:  Diagnosis Date   Abnormal Pap smear of cervix    Cervical radiculopathy    Dense breast tissue    Diverticulosis 2014   DJD (degenerative joint disease)    GERD (gastroesophageal reflux disease)    Hammer toe of right foot    Heart murmur    Hepatic hemangioma    right lobe   Mitral valve insufficiency and aortic valve insufficiency    mild   Seasonal allergies    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   Past Surgical History:  Procedure Laterality Date   CESAREAN SECTION  1990   CHOLECYSTECTOMY  12/16/2012   COLONOSCOPY  07/22/2013   COLONOSCOPY WITH PROPOFOL  N/A 10/13/2023   Procedure: COLONOSCOPY WITH PROPOFOL ;  Surgeon: Maryruth Ole DASEN, MD;  Location: [facility] ENDOSCOPY;  Service: Endoscopy;  Laterality: N/A;   KNEE ARTHROPLASTY Left 02/16/2024   Procedure: ARTHROPLASTY, KNEE, TOTAL, USING IMAGELESS COMPUTER-ASSISTED NAVIGATION;  Surgeon: Mardee Lynwood SQUIBB, MD;  Location: [facility] ORS;  Service: Orthopedics;  Laterality: Left;   VAGINAL HYSTERECTOMY  2005   Patient Active Problem List   Diagnosis Date Noted   History of total knee arthroplasty, left 02/16/2024   Complex cyst of left ovary 02/15/2024   Pelvic pain in female 02/15/2024   Primary osteoarthritis of left knee 02/02/2023   S/P cholecystectomy 12/25/2012   Hepatic  hemangioma 12/25/2012   S/P hysterectomy 12/25/2012   Rubella during pregnancy 12/25/2012   H/O mumps 12/25/2012   History of chicken pox 12/25/2012   Absence of both cervix and uterus, acquired 12/25/2012   Other viral diseases complicating pregnancy, unspecified trimester 12/25/2012   Personal history of other infectious and parasitic diseases 12/25/2012    PCP: Valora Lynwood, MD  REFERRING PROVIDER: Hilma Hastings, PA-C   REFERRING DIAG:  [phone] (ICD-10-CM) - Chronic bilateral low back pain without sciatica M47.816 (ICD-10-CM) - Lumbar spondylosis M51.360 (ICD-10-CM) - Degeneration of intervertebral disc of lumbar region with discogenic back pain  Rationale for Evaluation and Treatment: Rehabilitation  THERAPY DIAG:  Muscle weakness (generalized)  Bilateral low back pain, unspecified chronicity, unspecified whether sciatica present  ONSET DATE: chronic  SUBJECTIVE:  SUBJECTIVE STATEMENT: Patient reports her back pain has been going on for years but comes and goes. She notices it every morning and feeling stiff. She uses heating pad and moves around, it tends to feel better. She has an adjustable mattress and finds that the pain is not as bad with her head slightly elevated.   PERTINENT HISTORY:  Per PA note on 09/01/24, She has intermittent low back pain with no leg pain x years. Pain is more constant in the morning- she has a lot of stiffness. Pain improves with heating pad and moving around. Pain also worse with getting up after prolonged sitting. No leg pain. History of recent left TKA on 02/16/24 with numbness lateral knee and occasional numbness lateral ankle.  PAIN:  Are you having pain? Yes: NPRS scale: 0/10; 10/10 worst  Pain location: low back  Pain description: stiffness,  achy Aggravating factors: sleeping, getting up first thing in the morning  Relieving factors: movement, heat  PRECAUTIONS: None  RED FLAGS: None   WEIGHT BEARING RESTRICTIONS: No  FALLS:  Has patient fallen in last 6 months? No  LIVING ENVIRONMENT: Lives with: lives alone Lives in: House/apartment  OCCUPATION: partially retired   PLOF: Independent  PATIENT GOALS: for pain to Mykela Mewborn away   OBJECTIVE:  Note: Objective measures were completed at Evaluation unless otherwise noted.  DIAGNOSTIC FINDINGS:  N/A  PATIENT SURVEYS:  Modified Oswestry:  MODIFIED OSWESTRY DISABILITY SCALE  Date: 09/07/24 Score  Pain intensity 0 = I can tolerate the pain I have without having to use pain medication.  2. Personal care (washing, dressing, etc.) 1 =  I can take care of myself normally, but it increases my pain.  3. Lifting 4 = I can lift only very light weights  4. Walking 1 = Pain prevents me from walking more than 1 mile.  5. Sitting 2 =  Pain prevents me from sitting more than 1 hour.  6. Standing 2 =  Pain prevents me from standing more than 1 hour  7. Sleeping 0 = Pain does not prevent me from sleeping well.  8. Social Life 1 =  My social life is normal, but it increases my level of pain.  9. Traveling 1 =  I can travel anywhere, but it increases my pain.  10. Employment/ Homemaking 1 = My normal homemaking/job activities increase my pain, but I can still perform all that is required of me  Total 13/50 = 26%   Interpretation of scores: Score Category Description  0-20% Minimal Disability The patient can cope with most living activities. Usually no treatment is indicated apart from advice on lifting, sitting and exercise  21-40% Moderate Disability The patient experiences more pain and difficulty with sitting, lifting and standing. Travel and social life are more difficult and they may be disabled from work. Personal care, sexual activity and sleeping are not grossly affected, and the  patient can usually be managed by conservative means  41-60% Severe Disability Pain remains the main problem in this group, but activities of daily living are affected. These patients require a detailed investigation  61-80% Crippled Back pain impinges on all aspects of the patient's life. Positive intervention is required  81-100% Bed-bound  These patients are either bed-bound or exaggerating their symptoms  Bluford FORBES [name] DELENA Karon DELENA, et al. Surgery versus conservative management of stable thoracolumbar fracture: the PRESTO feasibility RCT. [place] ([place]): [employer]; 2021 Nov. [facility] Technology Assessment, No. 25.62.) Appendix 3, Oswestry Disability Index category  descriptors. Available from: Findjewelers.cz  Minimally Clinically Important Difference (MCID) = 12.8%  COGNITION: Overall cognitive status: Within functional limits for tasks assessed     SENSATION: WFL  MUSCLE LENGTH: Hamstrings: WFL   POSTURE: No Significant postural limitations  PALPATION: Tightness noted to lumbar paraspinals, QL, and B piriformis - slight tenderness to lumbar paraspinals   LUMBAR ROM:   AROM eval  Flexion WFL  Extension WFL  Right lateral flexion WFL  Left lateral flexion WFL  Right rotation WFL  Left rotation WFL   (Blank rows = not tested)  LOWER EXTREMITY ROM:     WFL   LOWER EXTREMITY MMT:    MMT Right eval Left eval  Hip flexion 4- 4  Hip extension    Hip abduction 4 4  Hip adduction 4+ 4+  Hip internal rotation    Hip external rotation    Knee flexion 4+ 4+  Knee extension 4+ 4+  Ankle dorsiflexion 4+ 4+  Ankle plantarflexion    Ankle inversion    Ankle eversion     (Blank rows = not tested)  LUMBAR SPECIAL TESTS:  Straight leg raise test: Negative and FABER test: Negative  FUNCTIONAL TESTS:  5 times sit to stand: 11.75 seconds 6 minute walk test: to be completed visit #2   GAIT: Distance walked: 57' Assistive  device utilized: None Level of assistance: Complete Independence Comments: no significant gait deviations   TREATMENT DATE: 10/26/24                                                                                                                               Subjective: Patient reported moderate soreness following last session. She said soreness was mitigated with tylenol  and rest. Back to baseline after two days. Currently she has 2/10 pain in the R lower back after cleaning windows yesterday. No further questions or concern.   Therapeutic Exercise:    Seated Barbell Twist for core activation    3 x 20 - 25# Barbell    Supine 90/90 with Alternating LE extension    3 x 10    Dead bug     3 x 10 - alternating UE/LE   - cue for slow tempo    Lumbar Crossover Stretch R: 30s/bout x 2 in order to improve tissue extensibility  Single Knee to Chest  R: 30s/bout x 2 in order to improve tissue extensibility  Gluteal Stretch   R: 30s/bout x 2 in order to improve tissue    Therapeutic Activity:  Nustep level 6-3 x 5 minutes to improve trunk mobility and BLE strength. PT manually adjusting resistance within patient tolerance.   Lateral Stepping Against Resistance     4 x 12' - Blue TB around ankle (Seated rest break)    4 x 12' - Blue TB around ankle    Kettle Bell Squat    1 x 10 - 10#    2 x 10 -  20#    Standing Pallof Press   1 x 10 - 5#    1 x 10 - 10#    PATIENT EDUCATION:  Education details: Exercise Technique  Person educated: Patient Education method: Explanation, Demonstration, and Handouts Education comprehension: verbalized understanding and returned demonstration  HOME EXERCISE PROGRAM: Access Code: [email or web address] URL: [email or web address] Date: 10/19/2024 Prepared by: Lonni Eisa Conaway  Exercises - Supine Lower Trunk Rotation  - 2 x daily - 5-7 x weekly - 10 reps - 3-5 second hold - Sidelying Thoracic Rotation with Open Book  - 2 x daily - 5-7 x weekly  - 10 reps - Seated Piriformis Stretch  - 2-3 x daily - 5-7 x weekly - 3-5 reps - 30-60 seconds hold - Seated Hamstring Stretch  - 2-3 x daily - 5-7 x weekly - 3-5 reps - 30-60 seconds hold - Supine Bridge with Resistance Band  - 1-2 x daily - 5-7 x weekly - 3 sets - 10 reps - Supine March with Resistance Band  - 1-2 x daily - 5-7 x weekly - 3 sets - 10 reps - Clamshell with Resistance  - 1-2 x daily - 5-7 x weekly - 3 sets - 10 reps - Diagonal Curl Up with Reach  - 1-2 x daily - 5-7 x weekly - 3 sets - 10 reps - Seated March with Resistance  - 1-2 x daily - 5-7 x weekly - 3 sets - 10 reps - Seated Knee Extension with Resistance  - 1-2 x daily - 5-7 x weekly - 3 sets - 10 reps - Side Stepping with Resistance at Feet  - 1 x daily - 5-7 x weekly - 2-3 sets - 10-12 reps - Supine 90/90 with Leg Extensions  - 1 x daily - 5-7 x weekly - 2-3 sets - 10-12 reps - Side Plank on Knees  - 1 x daily - 5-7 x weekly - 2-3 sets - 10-12 reps  Access Code: [email or web address] URL: [email or web address] Date: 09/21/2024 Prepared by: Maryanne Finder  Exercises - Supine Lower Trunk Rotation  - 2 x daily - 5-7 x weekly - 10 reps - 3-5 second hold - Sidelying Thoracic Rotation with Open Book  - 2 x daily - 5-7 x weekly - 10 reps - Seated Piriformis Stretch  - 2-3 x daily - 5-7 x weekly - 3-5 reps - 30-60 seconds hold - Seated Hamstring Stretch  - 2-3 x daily - 5-7 x weekly - 3-5 reps - 30-60 seconds hold - Supine Bridge with Resistance Band  - 1-2 x daily - 5-7 x weekly - 3 sets - 10 reps - Supine March with Resistance Band  - 1-2 x daily - 5-7 x weekly - 3 sets - 10 reps - Clamshell with Resistance  - 1-2 x daily - 5-7 x weekly - 3 sets - 10 reps - Diagonal Curl Up with Reach  - 1-2 x daily - 5-7 x weekly - 3 sets - 10 reps - Seated March with Resistance  - 1-2 x daily - 5-7 x weekly - 3 sets - 10 reps - Seated Knee Extension with Resistance  - 1-2 x daily - 5-7 x weekly - 3 sets - 10  reps  ASSESSMENT:  CLINICAL IMPRESSION:    Continued PT POC focused on LBP and stiffness. PT session focused on improving core stabilization and gluteal strength with functional movements. Great tolerance to progression of core exercises from supine to standing. Good carryover from last session with kettle bell squats -  minimal cues to keep weight close. Patient endorsed pain free in her lower back following PT interventions. Added additional stretches for gluteal muscle group with good return demo from patient. She still has intermittent pain in her R lower back that limits her fro full participation with community/recreational based activities. Based on today's performance, pt will continue to benefit from skilled PT in order to facilitate return to PLOF and improve QoL.   OBJECTIVE IMPAIRMENTS: Abnormal gait, decreased activity tolerance, difficulty walking, decreased strength, postural dysfunction, and pain.   ACTIVITY LIMITATIONS: lifting and sleeping  PARTICIPATION LIMITATIONS: cleaning, laundry, and community activity  PERSONAL FACTORS: Age, Past/current experiences, and Time since onset of injury/illness/exacerbation are also affecting patient's functional outcome.   REHAB POTENTIAL: Good  CLINICAL DECISION MAKING: Stable/uncomplicated  EVALUATION COMPLEXITY: Low   GOALS: Goals reviewed with patient? Yes  SHORT TERM GOALS: Target date: 09/28/2024  Patient will be independent in HEP to improve strength/mobility for better functional independence with ADLs.  Baseline: 09/07/24: HEP initiated; 10/19/2024: 60% adherence vocalized  Goal status: Progressing  LONG TERM GOALS: Target date: 11/10/2024  Patient will reduce modified Oswestry score to <20 as to demonstrate minimal disability with ADLs including improved sleeping tolerance, walking/sitting tolerance etc for better mobility with ADLs.   Baseline: 09/07/24: 13/50 = 26%; 10/19/2024: 18/50 = 36% Goal status: Progressing    2.  Patient will report a worst pain of 3/10 on NRPS in low back to improve tolerance with ADLs and reduced symptoms with activities. Baseline: 09/07/24: 10/10; 10/19/2024: 3/10 NPS Goal status: Goal Met  3.  Patient will improve 5xSTS by 3 seconds to demonstrate improvement in functional BLE strength.  Baseline: 09/07/24: 11.75 seconds; 10/19/2024: 9.52s Goal status: Progressing  4.  Patient will improve by 9m (164') in order to demonstrate clinically significant improvement in cardiopulmonary endurance and community ambulation  Baseline: 09/07/24: to be completed visit #2; 09/13/2024: 1368' with no AD 10/19/2024: 1413' pain in the L gluteal and lower back Goal status: Progressing    PLAN:  PT FREQUENCY: 1-2x/week  PT DURATION: 6 weeks  PLANNED INTERVENTIONS: 97164- PT Re-evaluation, 97750- Physical Performance Testing, 97110-Therapeutic exercises, 97530- Therapeutic activity, 97112- Neuromuscular re-education, 97535- Self Care, [id number]- Manual therapy, [id number]- Gait training, H9716- Electrical stimulation (unattended), [id number]- Electrical stimulation (manual), 20560 (1-2 muscles), 20561 (3+ muscles)- Dry Needling, Patient/Family education, Stair training, Spinal manipulation, Spinal mobilization, Cryotherapy, and Moist heat.  PLAN FOR NEXT SESSION: HEP review, stretching, manual STM to lumbar paraspinals, B hip strengthening   Lonni Pall PT, DPT Physical Therapist- [place]   [facility] 10/26/2024, 2:32 PM  Note: Per the evaluating physical therapist's plan of care, if patient does not return for follow up visit(s) related to this episode of care, this note will serve as their discharge note from physical therapy.

## 2024-10-27 ENCOUNTER — Encounter: Payer: Self-pay | Admitting: Orthopedic Surgery

## 2024-10-27 ENCOUNTER — Telehealth: Payer: Self-pay | Admitting: Orthopedic Surgery

## 2024-10-27 ENCOUNTER — Ambulatory Visit (INDEPENDENT_AMBULATORY_CARE_PROVIDER_SITE_OTHER): Admitting: Orthopedic Surgery

## 2024-10-27 VITALS — BP 110/72 | Ht 64.0 in | Wt 165.0 lb

## 2024-10-27 DIAGNOSIS — M47816 Spondylosis without myelopathy or radiculopathy, lumbar region: Secondary | ICD-10-CM

## 2024-10-27 DIAGNOSIS — M51379 Other intervertebral disc degeneration, lumbosacral region without mention of lumbar back pain or lower extremity pain: Secondary | ICD-10-CM | POA: Diagnosis not present

## 2024-10-27 DIAGNOSIS — M5136 Other intervertebral disc degeneration, lumbar region with discogenic back pain only: Secondary | ICD-10-CM

## 2024-10-27 NOTE — Telephone Encounter (Signed)
 Please let her know I sent additional PT orders.

## 2024-10-27 NOTE — Telephone Encounter (Signed)
 I spoke to patient and she states she went to PT yesterday and they told her that she has done all of the PT sessions that were allowed for that referral. She is asking if she can get another referral for PT again and see if Medicaid will cover them on the new referral.

## 2024-10-27 NOTE — Telephone Encounter (Signed)
 Patient following up from appointment today, She said she was worried about medicaid not covering anymore PT and that Glade said she could send a new order, Patient asked if a new PT order could be sent

## 2024-10-28 ENCOUNTER — Ambulatory Visit

## 2024-10-28 NOTE — Telephone Encounter (Signed)
 Left a message informing patient the PT referral order has been sent.

## 2024-11-01 ENCOUNTER — Encounter

## 2024-11-02 ENCOUNTER — Encounter

## 2024-11-08 ENCOUNTER — Encounter

## 2024-11-10 ENCOUNTER — Ambulatory Visit
